# Patient Record
Sex: Male | Born: 1955 | Race: Black or African American | Hispanic: No | Marital: Married | Smoking: Former smoker
Health system: Southern US, Community
[De-identification: ages and names within clinical notes are randomized; demographics above are authoritative.]

## PROBLEM LIST (undated history)

## (undated) DIAGNOSIS — I10 Essential (primary) hypertension: Secondary | ICD-10-CM

## (undated) DIAGNOSIS — R7303 Prediabetes: Secondary | ICD-10-CM

## (undated) DIAGNOSIS — I639 Cerebral infarction, unspecified: Secondary | ICD-10-CM

## (undated) DIAGNOSIS — N289 Disorder of kidney and ureter, unspecified: Secondary | ICD-10-CM

## (undated) DIAGNOSIS — I2510 Atherosclerotic heart disease of native coronary artery without angina pectoris: Secondary | ICD-10-CM

## (undated) HISTORY — DX: Essential (primary) hypertension: I10

## (undated) HISTORY — PX: OTHER SURGICAL HISTORY: SHX169

## (undated) HISTORY — DX: Atherosclerotic heart disease of native coronary artery without angina pectoris: I25.10

## (undated) HISTORY — DX: Prediabetes: R73.03

## (undated) HISTORY — DX: Cerebral infarction, unspecified: I63.9

## (undated) HISTORY — DX: Disorder of kidney and ureter, unspecified: N28.9

---

## 2016-05-15 LAB — BASIC METABOLIC PANEL
BUN: 17 mg/dL (ref 4–21)
Creatinine: 4.3 mg/dL — AB (ref 0.6–1.3)
Glucose: 171 mg/dL
POTASSIUM: 3.6 mmol/L (ref 3.4–5.3)
SODIUM: 137 mmol/L (ref 137–147)

## 2016-05-15 LAB — HEPATIC FUNCTION PANEL
ALK PHOS: 107 U/L (ref 25–125)
ALT: 8 U/L — AB (ref 10–40)
AST: 16 U/L (ref 14–40)
Bilirubin, Total: 0.8 mg/dL

## 2016-05-15 LAB — CBC AND DIFFERENTIAL
HEMATOCRIT: 42 % (ref 41–53)
HEMOGLOBIN: 13.9 g/dL (ref 13.5–17.5)
Platelets: 168 10*3/uL (ref 150–399)
WBC: 17 10^3/mL

## 2016-05-15 LAB — POCT INR: INR: 1.1 (ref 0.9–1.1)

## 2016-05-16 LAB — BASIC METABOLIC PANEL
BUN: 30 mg/dL — AB (ref 4–21)
CREATININE: 6.3 mg/dL — AB (ref 0.6–1.3)
Glucose: 118 mg/dL
POTASSIUM: 3.2 mmol/L — AB (ref 3.4–5.3)
Sodium: 137 mmol/L (ref 137–147)

## 2016-05-16 LAB — CBC AND DIFFERENTIAL
HCT: 37 % — AB (ref 41–53)
HCT: 34 % — AB (ref 41–53)
HEMATOCRIT: 36 % — AB (ref 41–53)
HEMOGLOBIN: 10.9 g/dL — AB (ref 13.5–17.5)
HEMOGLOBIN: 11.9 g/dL — AB (ref 13.5–17.5)
HEMOGLOBIN: 12.4 g/dL — AB (ref 13.5–17.5)
PLATELETS: 148 10*3/uL — AB (ref 150–399)
WBC: 9.8 10^3/mL

## 2016-05-17 LAB — BASIC METABOLIC PANEL
BUN: 39 mg/dL — AB (ref 4–21)
Creatinine: 8.3 mg/dL — AB (ref 0.6–1.3)
GLUCOSE: 163 mg/dL
POTASSIUM: 3.1 mmol/L — AB (ref 3.4–5.3)
Sodium: 139 mmol/L (ref 137–147)

## 2016-05-17 LAB — HEPATIC FUNCTION PANEL
AST: 9 U/L — AB (ref 14–40)
Alkaline Phosphatase: 78 U/L (ref 25–125)
BILIRUBIN, TOTAL: 0.7 mg/dL

## 2016-05-17 LAB — CBC AND DIFFERENTIAL
HCT: 33 % — AB (ref 41–53)
HEMOGLOBIN: 10.9 g/dL — AB (ref 13.5–17.5)
Platelets: 141 10*3/uL — AB (ref 150–399)
WBC: 4.6 10*3/mL

## 2016-05-19 LAB — HEPATIC FUNCTION PANEL
ALK PHOS: 82 U/L (ref 25–125)
AST: 10 U/L — AB (ref 14–40)
BILIRUBIN, TOTAL: 0.5 mg/dL

## 2016-05-19 LAB — CBC AND DIFFERENTIAL
HEMATOCRIT: 31 % — AB (ref 41–53)
HEMOGLOBIN: 10.2 g/dL — AB (ref 13.5–17.5)
PLATELETS: 167 10*3/uL (ref 150–399)
WBC: 4.2 10^3/mL

## 2016-05-19 LAB — BASIC METABOLIC PANEL
BUN: 27 mg/dL — AB (ref 4–21)
Creatinine: 7.4 mg/dL — AB (ref 0.6–1.3)
Glucose: 180 mg/dL
Potassium: 3.8 mmol/L (ref 3.4–5.3)
SODIUM: 135 mmol/L — AB (ref 137–147)

## 2016-05-20 LAB — CBC AND DIFFERENTIAL
HEMATOCRIT: 34 % — AB (ref 41–53)
Hemoglobin: 10.9 g/dL — AB (ref 13.5–17.5)
PLATELETS: 178 10*3/uL (ref 150–399)
WBC: 4.4 10^3/mL

## 2016-05-21 LAB — HEPATIC FUNCTION PANEL
ALK PHOS: 89 U/L (ref 25–125)
AST: 9 U/L — AB (ref 14–40)
Bilirubin, Total: 0.3 mg/dL

## 2016-05-21 LAB — BASIC METABOLIC PANEL
BUN: 29 mg/dL — AB (ref 4–21)
Creatinine: 6.1 mg/dL — AB (ref 0.6–1.3)
GLUCOSE: 162 mg/dL
Potassium: 4.7 mmol/L (ref 3.4–5.3)
SODIUM: 136 mmol/L — AB (ref 137–147)

## 2016-05-24 ENCOUNTER — Encounter: Payer: Self-pay | Admitting: Internal Medicine

## 2016-05-24 ENCOUNTER — Non-Acute Institutional Stay (SKILLED_NURSING_FACILITY): Payer: Medicare Other | Admitting: Internal Medicine

## 2016-05-24 DIAGNOSIS — E1122 Type 2 diabetes mellitus with diabetic chronic kidney disease: Secondary | ICD-10-CM | POA: Insufficient documentation

## 2016-05-24 DIAGNOSIS — K2990 Gastroduodenitis, unspecified, without bleeding: Secondary | ICD-10-CM

## 2016-05-24 DIAGNOSIS — E1169 Type 2 diabetes mellitus with other specified complication: Secondary | ICD-10-CM

## 2016-05-24 DIAGNOSIS — N186 End stage renal disease: Secondary | ICD-10-CM | POA: Diagnosis not present

## 2016-05-24 DIAGNOSIS — I639 Cerebral infarction, unspecified: Secondary | ICD-10-CM | POA: Diagnosis not present

## 2016-05-24 DIAGNOSIS — Z992 Dependence on renal dialysis: Secondary | ICD-10-CM

## 2016-05-24 DIAGNOSIS — K2970 Gastritis, unspecified, without bleeding: Secondary | ICD-10-CM

## 2016-05-24 DIAGNOSIS — K922 Gastrointestinal hemorrhage, unspecified: Secondary | ICD-10-CM | POA: Diagnosis not present

## 2016-05-24 DIAGNOSIS — N289 Disorder of kidney and ureter, unspecified: Secondary | ICD-10-CM | POA: Insufficient documentation

## 2016-05-24 DIAGNOSIS — I69320 Aphasia following cerebral infarction: Secondary | ICD-10-CM

## 2016-05-24 DIAGNOSIS — I120 Hypertensive chronic kidney disease with stage 5 chronic kidney disease or end stage renal disease: Secondary | ICD-10-CM | POA: Diagnosis not present

## 2016-05-24 DIAGNOSIS — E785 Hyperlipidemia, unspecified: Secondary | ICD-10-CM

## 2016-05-24 DIAGNOSIS — IMO0002 Reserved for concepts with insufficient information to code with codable children: Secondary | ICD-10-CM

## 2016-05-24 DIAGNOSIS — E1339 Other specified diabetes mellitus with other diabetic ophthalmic complication: Secondary | ICD-10-CM | POA: Diagnosis not present

## 2016-05-24 DIAGNOSIS — H42 Glaucoma in diseases classified elsewhere: Secondary | ICD-10-CM

## 2016-05-24 DIAGNOSIS — N185 Chronic kidney disease, stage 5: Secondary | ICD-10-CM

## 2016-05-24 DIAGNOSIS — I2510 Atherosclerotic heart disease of native coronary artery without angina pectoris: Secondary | ICD-10-CM | POA: Insufficient documentation

## 2016-05-24 NOTE — Progress Notes (Signed)
MRN: [id number] Name: Kenneth Mckenzie  Sex: male Age: 60 y.o. DOB: 12-Apr-1956  PSC #:  Facility/Room: [facility] / [facility] P Level Of Care: SNF Provider: Randon Goldsmith. Lyn Hollingshead, MD Emergency Contacts: No emergency contact information on file.  Code Status: Full Code  Allergies: Zofran [ondansetron hcl]  Chief Complaint  Patient presents with  . New Admit To SNF    Admit to Facility    HPI: Patient is 60 y.o. male with ESRD on dialysis, CVA's, one as recent as 2-3 weeks ago, on eliquis who was admitted to [facility] from 8/27-9/2 with hematemesis. EGD showed gastritis/duodenitis and a mallory weiss rear. Pt was treated with protonix BID with ASA 81 mg only as stroke prophylaxis Hb dropped but not enough to require a transfusion..Pt is now stable and is admitted to SNF for OT/PT. While at at [facility] pt will be followed for multiple strokes,prophylaxed with ASA, HTN, tx with coreg and lisinopril and HLD, tx with zocor.  Past Medical History:  Diagnosis Date  . Borderline diabetic   . Coronary heart disease   . Hypertension   . Renal disease   . Stroke [facility])     Past Surgical History:  Procedure Laterality Date  . Open Heart Surgery        Medication List       Accurate as of 05/24/16 11:59 PM. Always use your most recent med list.          aspirin EC 81 MG tablet Take 81 mg by mouth daily.   carvedilol 25 MG tablet Commonly known as:  COREG Take 25 mg by mouth 2 (two) times daily with a meal.   insulin lispro 100 UNIT/ML injection Commonly known as:  HUMALOG Inject into the skin 3 (three) times daily before meals. Use Sliding scale   lisinopril 40 MG tablet Commonly known as:  PRINIVIL,ZESTRIL Take 40 mg by mouth daily.   simvastatin 20 MG tablet Commonly known as:  ZOCOR Take 20 mg by mouth at bedtime.   timolol 0.5 % ophthalmic solution Commonly known as:  BETIMOL Place 1 drop into both eyes 2 (two) times daily.       Meds ordered this encounter  Medications  .  aspirin EC 81 MG tablet    Sig: Take 81 mg by mouth daily.  . carvedilol (COREG) 25 MG tablet    Sig: Take 25 mg by mouth 2 (two) times daily with a meal.  . insulin lispro (HUMALOG) 100 UNIT/ML injection    Sig: Inject into the skin 3 (three) times daily before meals. Use Sliding scale  . lisinopril (PRINIVIL,ZESTRIL) 40 MG tablet    Sig: Take 40 mg by mouth daily.  . simvastatin (ZOCOR) 20 MG tablet    Sig: Take 20 mg by mouth at bedtime.  . timolol (BETIMOL) 0.5 % ophthalmic solution    Sig: Place 1 drop into both eyes 2 (two) times daily.    Immunization History  Administered Date(s) Administered  . PPD Test 05/21/2016    Social History  Substance Use Topics  . Smoking status: Former Games developer  . Smokeless tobacco: Never Used  . Alcohol use No    Family history is   Family History  Problem Relation Age of Onset  . Diabetes Mother   . Hypertension Mother   . Diabetes Father   . Hypertension Father       Review of Systems  UTO 2/2 aphasia    Vitals:   05/24/16 1041  BP:  137/73  Pulse: (!) 56  Resp: 20  Temp: 97.1 F (36.2 C)    SpO2 Readings from Last 1 Encounters:  No data found for SpO2        Physical Exam  GENERAL APPEARANCE: Alert, minconversant,  No acute distress.  SKIN: No diaphoresis rash HEAD: Normocephalic, atraumatic  EYES: Conjunctiva/lids clear. Pupils round, reactive. EOMs intact.  EARS: External exam WNL, canals clear. Hearing grossly normal.  NOSE: No deformity or discharge.  MOUTH/THROAT: Lips w/o lesions  RESPIRATORY: Breathing is even, unlabored. Lung sounds are clear   CARDIOVASCULAR: Heart RRR no murmurs, rubs or gallops. No peripheral edema.   GASTROINTESTINAL: Abdomen is soft, non-tender, not distended w/ normal bowel sounds. GENITOURINARY: Bladder non tender, not distended  MUSCULOSKELETAL: R hand muscle wasting NEUROLOGIC:  Cranial nerves 2-12 grossly intact; R hemiparesis, L hemiparesis with limb ataxia  PSYCHIATRIC:  frustrated, cognitive problem?, no behavioral issues  Patient Active Problem List   Diagnosis Date Noted  . Borderline diabetic   . Hypertension   . Renal disease   . Coronary heart disease   . Stroke [facility](HCC)        Component Value Date/Time   WBC 4.4 05/20/2016   HGB 10.9 (A) 05/20/2016   HCT 34 (A) 05/20/2016   PLT 178 05/20/2016        Component Value Date/Time   NA 136 (A) 05/21/2016   K 4.7 05/21/2016   BUN 29 (A) 05/21/2016   CREATININE 6.1 (A) 05/21/2016   AST 9 (A) 05/21/2016   ALT 8 (A) 05/15/2016 0329   ALKPHOS 89 05/21/2016    No results found for: HGBA1C  No results found for: CHOL, HDL, LDLCALC, LDLDIRECT, TRIG, CHOLHDL   Patient was never admitted.  Not all labs, radiology exams or other studies done during hospitalization come through on my EPIC note; however they are reviewed by me.    Assessment and Plan  UPPER GI BLEED/ GASTRITIS/ DUODENITIS- pt presented with hematemesis;EGD revealed gastritis, duodenitis and mallory weiss tear. Pt was put on protonix 40 mg BID; Hb dropped from 13.9 to 10.9 SNF - pt is admitted for OT/PT; will f/u CBC  MULTIPLE STROKES/ APHASIA - per hx pt has had a stroke prior, was placed on eliquis which was stopped2/2 to GI bleed then restarted. Recent stroke 2 weeks ago, [facility] because pt had a stroke while in process of moving to [place] but was signed out by family and brought to ED vomiting blood.  SNF - being prophylaxed now with ASA 81 mg daily; admitted for OT/PT, maybe residential care. Pt will require all ADL care including feeding  ESRD on dialysis- TTS schedule SNF - cont dialysis  DM2 - diet controlled SNF - was d/c with SSI; will follow BS ac, goal will be to get pt on set dose of insulin with meals; pt is on ACE and statin  HTN SNF - cont soreg 25 mg BID and lisinopril 40 mg daily; controlled  HLD SNF - not stated as uncontrolled;cont Zocor 20 mg nightly  GLAUCOMA SNF- CONT TIMOLOL ou  bid  tIME SPENT > 45 MIN;> 50% of time with patient was spent reviewing records, labs, tests and studies, counseling and developing plan of care  Thurston Holenne D. Lyn HollingsheadAlexander, MD

## 2016-05-29 ENCOUNTER — Encounter: Payer: Self-pay | Admitting: Internal Medicine

## 2016-05-29 DIAGNOSIS — N186 End stage renal disease: Secondary | ICD-10-CM | POA: Insufficient documentation

## 2016-05-29 DIAGNOSIS — E785 Hyperlipidemia, unspecified: Secondary | ICD-10-CM

## 2016-05-29 DIAGNOSIS — IMO0002 Reserved for concepts with insufficient information to code with codable children: Secondary | ICD-10-CM | POA: Insufficient documentation

## 2016-05-29 DIAGNOSIS — K922 Gastrointestinal hemorrhage, unspecified: Secondary | ICD-10-CM | POA: Insufficient documentation

## 2016-05-29 DIAGNOSIS — K2990 Gastroduodenitis, unspecified, without bleeding: Secondary | ICD-10-CM

## 2016-05-29 DIAGNOSIS — Z992 Dependence on renal dialysis: Secondary | ICD-10-CM

## 2016-05-29 DIAGNOSIS — E1339 Other specified diabetes mellitus with other diabetic ophthalmic complication: Secondary | ICD-10-CM | POA: Insufficient documentation

## 2016-05-29 DIAGNOSIS — H42 Glaucoma in diseases classified elsewhere: Secondary | ICD-10-CM

## 2016-05-29 DIAGNOSIS — K2970 Gastritis, unspecified, without bleeding: Secondary | ICD-10-CM | POA: Insufficient documentation

## 2016-05-29 DIAGNOSIS — E1169 Type 2 diabetes mellitus with other specified complication: Secondary | ICD-10-CM | POA: Insufficient documentation

## 2016-06-07 ENCOUNTER — Non-Acute Institutional Stay (SKILLED_NURSING_FACILITY): Payer: Medicare Other | Admitting: Internal Medicine

## 2016-06-07 ENCOUNTER — Encounter: Payer: Self-pay | Admitting: Internal Medicine

## 2016-06-07 DIAGNOSIS — E1122 Type 2 diabetes mellitus with diabetic chronic kidney disease: Secondary | ICD-10-CM

## 2016-06-07 DIAGNOSIS — IMO0002 Reserved for concepts with insufficient information to code with codable children: Secondary | ICD-10-CM

## 2016-06-07 DIAGNOSIS — I120 Hypertensive chronic kidney disease with stage 5 chronic kidney disease or end stage renal disease: Secondary | ICD-10-CM | POA: Diagnosis not present

## 2016-06-07 DIAGNOSIS — E1339 Other specified diabetes mellitus with other diabetic ophthalmic complication: Secondary | ICD-10-CM | POA: Diagnosis not present

## 2016-06-07 DIAGNOSIS — N186 End stage renal disease: Secondary | ICD-10-CM | POA: Diagnosis not present

## 2016-06-07 DIAGNOSIS — K2990 Gastroduodenitis, unspecified, without bleeding: Secondary | ICD-10-CM | POA: Diagnosis not present

## 2016-06-07 DIAGNOSIS — Z992 Dependence on renal dialysis: Secondary | ICD-10-CM | POA: Diagnosis not present

## 2016-06-07 DIAGNOSIS — E1169 Type 2 diabetes mellitus with other specified complication: Secondary | ICD-10-CM | POA: Diagnosis not present

## 2016-06-07 DIAGNOSIS — I69320 Aphasia following cerebral infarction: Secondary | ICD-10-CM

## 2016-06-07 DIAGNOSIS — N185 Chronic kidney disease, stage 5: Secondary | ICD-10-CM

## 2016-06-07 DIAGNOSIS — H42 Glaucoma in diseases classified elsewhere: Secondary | ICD-10-CM | POA: Diagnosis not present

## 2016-06-07 DIAGNOSIS — K2970 Gastritis, unspecified, without bleeding: Secondary | ICD-10-CM

## 2016-06-07 DIAGNOSIS — I639 Cerebral infarction, unspecified: Secondary | ICD-10-CM | POA: Diagnosis not present

## 2016-06-07 DIAGNOSIS — K922 Gastrointestinal hemorrhage, unspecified: Secondary | ICD-10-CM | POA: Diagnosis not present

## 2016-06-07 DIAGNOSIS — E785 Hyperlipidemia, unspecified: Secondary | ICD-10-CM

## 2016-06-07 NOTE — Progress Notes (Signed)
MRN: [id number] Name: Kenneth Mckenzie  Sex: male Age: 60 y.o. DOB: December 21, 1955  PSC #:  Facility/Room: [facility] / [facility] Level Of Care: SNF Provider: Randon Goldsmith. Lyn Hollingshead, MD Emergency Contacts: No emergency contact information on file.  Code Status: Full Code  Allergies: Zofran [ondansetron hcl]  Chief Complaint  Patient presents with  . Discharge Note    Discharged from SNF    HPI: Patient is 60 y.o. male with ESRD on dialysis, CVA's, one as recent as 2-3 weeks ago, on eliquis who was admitted to [facility] from 8/27-9/2 with hematemesis. EGD showed gastritis/duodenitis and a mallory weiss rear. Pt was treated with protonix BID with ASA 81 mg only as stroke prophylaxis Hb dropped but not enough to require a transfusion. Pt was admitted to SNF for OT/PT and is now ready to be d/c to home.  Past Medical History:  Diagnosis Date  . Borderline diabetic   . Coronary heart disease   . Hypertension   . Renal disease   . Stroke [facility])     Past Surgical History:  Procedure Laterality Date  . Open Heart Surgery        Medication List       Accurate as of 06/07/16  2:49 PM. Always use your most recent med list.          aspirin EC 81 MG tablet Take 81 mg by mouth daily.   carvedilol 25 MG tablet Commonly known as:  COREG Take 25 mg by mouth 2 (two) times daily with a meal.   insulin lispro 100 UNIT/ML injection Commonly known as:  HUMALOG Inject into the skin 3 (three) times daily before meals. Use Sliding scale 70 - 120 = 0 units, 121 - 150 = 1 units; 151 - 200 = 2 units, 201 - 250 = 3 units, 251 - 300 = 5 units, 301 - 350 = 7 units, 351 - 400 = 9 units; Greater than 400 call MD and give 9 units   lisinopril 40 MG tablet Commonly known as:  PRINIVIL,ZESTRIL Take 40 mg by mouth daily.   pantoprazole 40 MG tablet Commonly known as:  PROTONIX Take 40 mg by mouth 2 (two) times daily. Take twice daily for 6 weeks, then take 1 daily.   simvastatin 20 MG tablet Commonly known  as:  ZOCOR Take 20 mg by mouth at bedtime.   timolol 0.5 % ophthalmic solution Commonly known as:  BETIMOL Place 1 drop into both eyes 2 (two) times daily.       Meds ordered this encounter  Medications  . pantoprazole (PROTONIX) 40 MG tablet    Sig: Take 40 mg by mouth 2 (two) times daily. Take twice daily for 6 weeks, then take 1 daily.    Immunization History  Administered Date(s) Administered  . PPD Test 05/21/2016    Social History  Substance Use Topics  . Smoking status: Former Games developer  . Smokeless tobacco: Never Used  . Alcohol use No    Vitals:   06/07/16 1000  Pulse: 70  Resp: 20    Physical Exam  GENERAL APPEARANCE: Alert, conversant. No acute distress.  HEENT: Unremarkable. RESPIRATORY: Breathing is even, unlabored. Lung sounds are clear   CARDIOVASCULAR: Heart RRR no murmurs, rubs or gallops. No peripheral edema.  GASTROINTESTINAL: Abdomen is soft, non-tender, not distended w/ normal bowel sounds.  NEUROLOGIC: Cranial nerves 2-12 grossly intact x aphasia; R hemiparesis; L hemiparesis with ataxia  Patient Active Problem List   Diagnosis Date Noted  .  Acute upper GI bleed 05/29/2016  . Gastritis and gastroduodenitis 05/29/2016  . Aphasia complicating stroke 05/29/2016  . ESRD on dialysis (HCC) 05/29/2016  . Hyperlipidemia associated with type 2 diabetes mellitus (HCC) 05/29/2016  . Glaucoma due to secondary diabetes (HCC) 05/29/2016  . DM type 2 causing ESRD (HCC)   . Hypertension associated with stage 5 chronic kidney disease due to type 2 diabetes mellitus (HCC)   . Renal disease   . Coronary heart disease   . CVA (cerebrovascular accident) (HCC)     CBC    Component Value Date/Time   WBC 4.4 05/20/2016   HGB 10.9 (A) 05/20/2016   HCT 34 (A) 05/20/2016   PLT 178 05/20/2016    CMP     Component Value Date/Time   NA 136 (A) 05/21/2016   K 4.7 05/21/2016   BUN 29 (A) 05/21/2016   CREATININE 6.1 (A) 05/21/2016   AST 9 (A) 05/21/2016    ALT 8 (A) 05/15/2016 0329   ALKPHOS 89 05/21/2016    Assessment and Plan  Pt is being d/c to home with HH/OT/PT/Nursing. DME needed is mechanical lift. Medications have been reconciled and Rx's written.   Time spent > 30 min;> 50% of time with patient was spent reviewing records, labs, tests and studies, counseling and developing plan of care  Randon Goldsmithnne D. Lyn HollingsheadAlexander, MD

## 2017-01-23 ENCOUNTER — Telehealth: Payer: Self-pay | Admitting: Surgery

## 2017-01-23 ENCOUNTER — Encounter: Payer: Self-pay | Admitting: Surgery

## 2017-01-23 NOTE — Telephone Encounter (Signed)
pt canceled via phone service because they didn't know what the appt was for, I tried to call to let them know why Dr Lowell GuitarPowell felt it was of an urgent need to be seen (due to Ulcer over access area) No reply, cancel per pt, I called Dr Roanna BanningPowell's office at [phone], to inform them of this so they can handle it from here. 01/23/17 bg

## 2017-02-24 ENCOUNTER — Other Ambulatory Visit: Payer: Self-pay | Admitting: *Deleted

## 2017-02-24 DIAGNOSIS — N186 End stage renal disease: Secondary | ICD-10-CM

## 2017-02-24 DIAGNOSIS — Z01810 Encounter for preprocedural cardiovascular examination: Secondary | ICD-10-CM

## 2017-02-27 ENCOUNTER — Other Ambulatory Visit (HOSPITAL_COMMUNITY): Payer: Self-pay

## 2017-02-27 ENCOUNTER — Inpatient Hospital Stay (HOSPITAL_COMMUNITY): Admission: RE | Admit: 2017-02-27 | Payer: Self-pay | Source: Ambulatory Visit

## 2017-02-27 ENCOUNTER — Encounter: Payer: Self-pay | Admitting: Surgery

## 2017-03-01 ENCOUNTER — Other Ambulatory Visit (HOSPITAL_COMMUNITY): Payer: Self-pay

## 2017-03-01 ENCOUNTER — Encounter (HOSPITAL_COMMUNITY): Payer: Self-pay

## 2017-03-01 ENCOUNTER — Encounter: Payer: Self-pay | Admitting: Vascular Surgery

## 2017-09-22 ENCOUNTER — Other Ambulatory Visit: Payer: Self-pay

## 2017-09-22 ENCOUNTER — Inpatient Hospital Stay (HOSPITAL_COMMUNITY)
Admission: EM | Admit: 2017-09-22 | Discharge: 2017-09-29 | DRG: 177 | Disposition: A | Discharge status: Hospice | Payer: Medicare Other | Attending: Oncology | Admitting: Oncology

## 2017-09-22 ENCOUNTER — Other Ambulatory Visit (HOSPITAL_COMMUNITY): Payer: Self-pay

## 2017-09-22 ENCOUNTER — Emergency Department (HOSPITAL_COMMUNITY): Payer: Medicare Other

## 2017-09-22 DIAGNOSIS — R000 Tachycardia, unspecified: Secondary | ICD-10-CM | POA: Diagnosis present

## 2017-09-22 DIAGNOSIS — F322 Major depressive disorder, single episode, severe without psychotic features: Secondary | ICD-10-CM | POA: Diagnosis not present

## 2017-09-22 DIAGNOSIS — I2510 Atherosclerotic heart disease of native coronary artery without angina pectoris: Secondary | ICD-10-CM | POA: Diagnosis present

## 2017-09-22 DIAGNOSIS — Z833 Family history of diabetes mellitus: Secondary | ICD-10-CM

## 2017-09-22 DIAGNOSIS — Z888 Allergy status to other drugs, medicaments and biological substances status: Secondary | ICD-10-CM

## 2017-09-22 DIAGNOSIS — E876 Hypokalemia: Secondary | ICD-10-CM

## 2017-09-22 DIAGNOSIS — Z79899 Other long term (current) drug therapy: Secondary | ICD-10-CM

## 2017-09-22 DIAGNOSIS — Z992 Dependence on renal dialysis: Secondary | ICD-10-CM | POA: Diagnosis not present

## 2017-09-22 DIAGNOSIS — D72829 Elevated white blood cell count, unspecified: Secondary | ICD-10-CM | POA: Diagnosis not present

## 2017-09-22 DIAGNOSIS — Z7901 Long term (current) use of anticoagulants: Secondary | ICD-10-CM

## 2017-09-22 DIAGNOSIS — D649 Anemia, unspecified: Secondary | ICD-10-CM | POA: Diagnosis not present

## 2017-09-22 DIAGNOSIS — Z8701 Personal history of pneumonia (recurrent): Secondary | ICD-10-CM

## 2017-09-22 DIAGNOSIS — I120 Hypertensive chronic kidney disease with stage 5 chronic kidney disease or end stage renal disease: Secondary | ICD-10-CM | POA: Diagnosis present

## 2017-09-22 DIAGNOSIS — Y95 Nosocomial condition: Secondary | ICD-10-CM | POA: Diagnosis present

## 2017-09-22 DIAGNOSIS — N186 End stage renal disease: Secondary | ICD-10-CM | POA: Diagnosis present

## 2017-09-22 DIAGNOSIS — R1310 Dysphagia, unspecified: Secondary | ICD-10-CM

## 2017-09-22 DIAGNOSIS — Z66 Do not resuscitate: Secondary | ICD-10-CM | POA: Diagnosis present

## 2017-09-22 DIAGNOSIS — I998 Other disorder of circulatory system: Secondary | ICD-10-CM | POA: Diagnosis present

## 2017-09-22 DIAGNOSIS — R791 Abnormal coagulation profile: Secondary | ICD-10-CM | POA: Diagnosis not present

## 2017-09-22 DIAGNOSIS — Z8249 Family history of ischemic heart disease and other diseases of the circulatory system: Secondary | ICD-10-CM

## 2017-09-22 DIAGNOSIS — E43 Unspecified severe protein-calorie malnutrition: Secondary | ICD-10-CM | POA: Diagnosis present

## 2017-09-22 DIAGNOSIS — I739 Peripheral vascular disease, unspecified: Secondary | ICD-10-CM | POA: Diagnosis not present

## 2017-09-22 DIAGNOSIS — J189 Pneumonia, unspecified organism: Secondary | ICD-10-CM

## 2017-09-22 DIAGNOSIS — Z87891 Personal history of nicotine dependence: Secondary | ICD-10-CM

## 2017-09-22 DIAGNOSIS — F319 Bipolar disorder, unspecified: Secondary | ICD-10-CM | POA: Diagnosis present

## 2017-09-22 DIAGNOSIS — J90 Pleural effusion, not elsewhere classified: Secondary | ICD-10-CM | POA: Diagnosis present

## 2017-09-22 DIAGNOSIS — I69391 Dysphagia following cerebral infarction: Secondary | ICD-10-CM

## 2017-09-22 DIAGNOSIS — L89152 Pressure ulcer of sacral region, stage 2: Secondary | ICD-10-CM | POA: Diagnosis present

## 2017-09-22 DIAGNOSIS — R0600 Dyspnea, unspecified: Secondary | ICD-10-CM | POA: Diagnosis not present

## 2017-09-22 DIAGNOSIS — Z7189 Other specified counseling: Secondary | ICD-10-CM | POA: Diagnosis not present

## 2017-09-22 DIAGNOSIS — J690 Pneumonitis due to inhalation of food and vomit: Secondary | ICD-10-CM | POA: Diagnosis present

## 2017-09-22 DIAGNOSIS — R627 Adult failure to thrive: Secondary | ICD-10-CM | POA: Diagnosis present

## 2017-09-22 DIAGNOSIS — Z681 Body mass index (BMI) 19 or less, adult: Secondary | ICD-10-CM

## 2017-09-22 DIAGNOSIS — Z794 Long term (current) use of insulin: Secondary | ICD-10-CM

## 2017-09-22 DIAGNOSIS — E1122 Type 2 diabetes mellitus with diabetic chronic kidney disease: Secondary | ICD-10-CM | POA: Diagnosis present

## 2017-09-22 DIAGNOSIS — Z7401 Bed confinement status: Secondary | ICD-10-CM

## 2017-09-22 DIAGNOSIS — Z993 Dependence on wheelchair: Secondary | ICD-10-CM | POA: Diagnosis not present

## 2017-09-22 DIAGNOSIS — Z515 Encounter for palliative care: Secondary | ICD-10-CM | POA: Diagnosis present

## 2017-09-22 DIAGNOSIS — R64 Cachexia: Secondary | ICD-10-CM | POA: Diagnosis present

## 2017-09-22 DIAGNOSIS — Z7982 Long term (current) use of aspirin: Secondary | ICD-10-CM

## 2017-09-22 DIAGNOSIS — I69321 Dysphasia following cerebral infarction: Secondary | ICD-10-CM | POA: Diagnosis not present

## 2017-09-22 DIAGNOSIS — R1312 Dysphagia, oropharyngeal phase: Secondary | ICD-10-CM | POA: Diagnosis not present

## 2017-09-22 DIAGNOSIS — M6249 Contracture of muscle, multiple sites: Secondary | ICD-10-CM | POA: Diagnosis not present

## 2017-09-22 DIAGNOSIS — M6259 Muscle wasting and atrophy, not elsewhere classified, multiple sites: Secondary | ICD-10-CM | POA: Diagnosis not present

## 2017-09-22 DIAGNOSIS — D631 Anemia in chronic kidney disease: Secondary | ICD-10-CM | POA: Diagnosis present

## 2017-09-22 DIAGNOSIS — I69322 Dysarthria following cerebral infarction: Secondary | ICD-10-CM

## 2017-09-22 LAB — COMPREHENSIVE METABOLIC PANEL
ALBUMIN: 1.7 g/dL — AB (ref 3.5–5.0)
ALT: 10 U/L — ABNORMAL LOW (ref 17–63)
AST: 23 U/L (ref 15–41)
Alkaline Phosphatase: 129 U/L — ABNORMAL HIGH (ref 38–126)
Anion gap: 19 — ABNORMAL HIGH (ref 5–15)
BUN: 5 mg/dL — AB (ref 6–20)
CO2: 24 mmol/L (ref 22–32)
Calcium: 7.9 mg/dL — ABNORMAL LOW (ref 8.9–10.3)
Chloride: 96 mmol/L — ABNORMAL LOW (ref 101–111)
Creatinine, Ser: 2.57 mg/dL — ABNORMAL HIGH (ref 0.61–1.24)
GFR calc Af Amer: 29 mL/min — ABNORMAL LOW (ref >60)
GFR calc non Af Amer: 25 mL/min — ABNORMAL LOW (ref >60)
Glucose, Bld: 88 mg/dL (ref 65–99)
POTASSIUM: 2.2 mmol/L — AB (ref 3.5–5.1)
Sodium: 139 mmol/L (ref 135–145)
Total Bilirubin: 1.6 mg/dL — ABNORMAL HIGH (ref 0.3–1.2)
Total Protein: 7.8 g/dL (ref 6.5–8.1)

## 2017-09-22 LAB — PHOSPHORUS
Phosphorus: 1.3 mg/dL — ABNORMAL LOW (ref 2.5–4.6)
Phosphorus: 2.4 mg/dL — ABNORMAL LOW (ref 2.5–4.6)

## 2017-09-22 LAB — CBC WITH DIFFERENTIAL/PLATELET
BASOS PCT: 0 %
Basophils Absolute: 0.1 10*3/uL (ref 0.0–0.1)
Eosinophils Absolute: 0 10*3/uL (ref 0.0–0.7)
Eosinophils Relative: 0 %
HEMATOCRIT: 30.4 % — AB (ref 39.0–52.0)
HEMOGLOBIN: 9.3 g/dL — AB (ref 13.0–17.0)
LYMPHS ABS: 1 10*3/uL (ref 0.7–4.0)
Lymphocytes Relative: 4 %
MCH: 28.1 pg (ref 26.0–34.0)
MCHC: 30.6 g/dL (ref 30.0–36.0)
MCV: 91.8 fL (ref 78.0–100.0)
MONOS PCT: 8 %
Monocytes Absolute: 1.8 10*3/uL — ABNORMAL HIGH (ref 0.1–1.0)
NEUTROS ABS: 20.6 10*3/uL — AB (ref 1.7–7.7)
NEUTROS PCT: 88 %
Platelets: 367 10*3/uL (ref 150–400)
RBC: 3.31 MIL/uL — ABNORMAL LOW (ref 4.22–5.81)
RDW: 15.7 % — ABNORMAL HIGH (ref 11.5–15.5)
WBC: 23.6 10*3/uL — ABNORMAL HIGH (ref 4.0–10.5)

## 2017-09-22 LAB — MAGNESIUM: MAGNESIUM: 1.7 mg/dL (ref 1.7–2.4)

## 2017-09-22 LAB — POTASSIUM: Potassium: 2.9 mmol/L — ABNORMAL LOW (ref 3.5–5.1)

## 2017-09-22 LAB — BASIC METABOLIC PANEL
ANION GAP: 13 (ref 5–15)
BUN: 8 mg/dL (ref 6–20)
CALCIUM: 7.5 mg/dL — AB (ref 8.9–10.3)
CHLORIDE: 98 mmol/L — AB (ref 101–111)
CO2: 27 mmol/L (ref 22–32)
CREATININE: 2.86 mg/dL — AB (ref 0.61–1.24)
GFR calc Af Amer: 26 mL/min — ABNORMAL LOW (ref >60)
GFR calc non Af Amer: 22 mL/min — ABNORMAL LOW (ref >60)
GLUCOSE: 152 mg/dL — AB (ref 65–99)
Potassium: 2.5 mmol/L — CL (ref 3.5–5.1)
Sodium: 138 mmol/L (ref 135–145)

## 2017-09-22 LAB — PROTIME-INR
INR: 1.42
Prothrombin Time: 17.2 seconds — ABNORMAL HIGH (ref 11.4–15.2)

## 2017-09-22 LAB — I-STAT CG4 LACTIC ACID, ED
Lactic Acid, Venous: 2.09 mmol/L (ref 0.5–1.9)
Lactic Acid, Venous: 2.94 mmol/L (ref 0.5–1.9)

## 2017-09-22 LAB — MRSA PCR SCREENING: MRSA BY PCR: NEGATIVE

## 2017-09-22 LAB — LACTIC ACID, PLASMA: Lactic Acid, Venous: 1.7 mmol/L (ref 0.5–1.9)

## 2017-09-22 MED ORDER — VANCOMYCIN HCL IN DEXTROSE 1-5 GM/200ML-% IV SOLN: 1000.0000 mg | Freq: Once | INTRAVENOUS | Status: DC

## 2017-09-22 MED ORDER — SODIUM CHLORIDE 0.9 % IV BOLUS (SEPSIS)
500.0000 mL | Freq: Once | INTRAVENOUS | Status: AC
  Administered 2017-09-22: 500 mL via INTRAVENOUS

## 2017-09-22 MED ORDER — POTASSIUM CHLORIDE 10 MEQ/100ML IV SOLN: 10.0000 meq | INTRAVENOUS | Status: DC

## 2017-09-22 MED ORDER — APIXABAN 2.5 MG PO TABS
2.5000 mg | ORAL_TABLET | Freq: Two times a day (BID) | ORAL | Status: DC
  Administered 2017-09-22 – 2017-09-29 (×12): 2.5 mg via ORAL
  Filled 2017-09-22 (×14): qty 1

## 2017-09-22 MED ORDER — MAGNESIUM SULFATE 2 GM/50ML IV SOLN
2.0000 g | Freq: Once | INTRAVENOUS | Status: AC
  Administered 2017-09-23: 2 g via INTRAVENOUS
  Filled 2017-09-22: qty 50

## 2017-09-22 MED ORDER — SODIUM CHLORIDE 0.9% FLUSH: 3.0000 mL | INTRAVENOUS | Status: DC | PRN

## 2017-09-22 MED ORDER — VANCOMYCIN HCL 500 MG IV SOLR: 500.0000 mg | INTRAVENOUS | Status: DC

## 2017-09-22 MED ORDER — SODIUM CHLORIDE 0.9% FLUSH
3.0000 mL | Freq: Two times a day (BID) | INTRAVENOUS | Status: DC
  Administered 2017-09-22 – 2017-09-28 (×6): 3 mL via INTRAVENOUS

## 2017-09-22 MED ORDER — ORAL CARE MOUTH RINSE
15.0000 mL | Freq: Two times a day (BID) | OROMUCOSAL | Status: DC
  Administered 2017-09-22 – 2017-09-29 (×11): 15 mL via OROMUCOSAL

## 2017-09-22 MED ORDER — SODIUM CHLORIDE 0.9 % IV SOLN
INTRAVENOUS | Status: AC
  Administered 2017-09-22: 10:00:00 via INTRAVENOUS

## 2017-09-22 MED ORDER — PIPERACILLIN-TAZOBACTAM IN DEX 2-0.25 GM/50ML IV SOLN
2.2500 g | Freq: Three times a day (TID) | INTRAVENOUS | Status: DC
  Administered 2017-09-22: 2.25 g via INTRAVENOUS
  Filled 2017-09-22 (×3): qty 50

## 2017-09-22 MED ORDER — POTASSIUM CHLORIDE 10 MEQ/100ML IV SOLN
10.0000 meq | INTRAVENOUS | Status: AC
  Administered 2017-09-22 (×4): 10 meq via INTRAVENOUS
  Filled 2017-09-22 (×4): qty 100

## 2017-09-22 MED ORDER — TIMOLOL MALEATE 0.5 % OP SOLN
1.0000 [drp] | Freq: Two times a day (BID) | OPHTHALMIC | Status: DC
Start: 2017-09-22 — End: 2017-09-29
  Administered 2017-09-22 – 2017-09-29 (×13): 1 [drp] via OPHTHALMIC
  Filled 2017-09-22: qty 5

## 2017-09-22 MED ORDER — DEXTROSE 5 % IV SOLN
2.0000 g | Freq: Once | INTRAVENOUS | Status: AC
  Administered 2017-09-22: 2 g via INTRAVENOUS
  Filled 2017-09-22: qty 2

## 2017-09-22 MED ORDER — POTASSIUM CHLORIDE 10 MEQ/100ML IV SOLN
10.0000 meq | INTRAVENOUS | Status: AC
  Administered 2017-09-22 (×2): 10 meq via INTRAVENOUS
  Filled 2017-09-22 (×2): qty 100

## 2017-09-22 MED ORDER — SODIUM CHLORIDE 0.9% FLUSH
3.0000 mL | Freq: Two times a day (BID) | INTRAVENOUS | Status: DC
  Administered 2017-09-25 – 2017-09-26 (×2): 3 mL via INTRAVENOUS

## 2017-09-22 MED ORDER — SODIUM CHLORIDE 0.9 % IV SOLN: 250.0000 mL | INTRAVENOUS | Status: DC | PRN

## 2017-09-22 MED ORDER — POTASSIUM CHLORIDE 10 MEQ/100ML IV SOLN
10.0000 meq | INTRAVENOUS | Status: AC
  Administered 2017-09-23 (×2): 10 meq via INTRAVENOUS
  Filled 2017-09-22 (×2): qty 100

## 2017-09-22 MED ORDER — DEXTROSE 5 % IV SOLN
20.0000 mmol | Freq: Once | INTRAVENOUS | Status: AC
  Administered 2017-09-22: 20 mmol via INTRAVENOUS
  Filled 2017-09-22: qty 6.67

## 2017-09-22 MED ORDER — PIPERACILLIN-TAZOBACTAM 3.375 G IVPB
3.3750 g | Freq: Two times a day (BID) | INTRAVENOUS | Status: DC
  Administered 2017-09-22 – 2017-09-27 (×9): 3.375 g via INTRAVENOUS
  Filled 2017-09-22 (×10): qty 50

## 2017-09-22 MED ORDER — CLINDAMYCIN PHOSPHATE 600 MG/50ML IV SOLN: 600.0000 mg | Freq: Once | INTRAVENOUS | Status: DC

## 2017-09-22 NOTE — ED Notes (Signed)
Patient resting in bed with spouse and children at his bedside. Patient cleaned, linen changed, no signs of pain/discomfort observed

## 2017-09-22 NOTE — H&P (Signed)
Date: 09/22/2017               Patient Name:  Kenneth Mckenzie MRN: [id number]  DOB: [birth year] Age / Sex: 62 y.o., male   PCP: Patient, No Pcp Per         Medical Service: Internal Medicine Teaching Service         Attending Physician: Dr. Mikey Bussing, Marthenia Rolling, DO    First Contact: Dr. Saunders Revel Pager: [phone]  Second Contact: Dr. Obie Dredge Pager: [phone]       After Hours (After 5p/  First Contact Pager: [phone]  weekends / holidays): Second Contact Pager: [phone]   Chief Complaint: Abnormal breathing  History of Present Illness:  Mr. Kenneth Mckenzie is a 62 year old male with an extensive past medical history significant for multiple CVAs, wheel-chair bound, frequent aspiration events, ESRD on HD (T,Th,S) presenting following recent discharge from [facility] with his wife for concerns for abnormal breathing. Patient is minimally responsive to questoins and will only nod yes or no to questions. Most of the history is obtained from the wife at bedside and from chart review. His wife reports that yesterday evening she noted that he was not breathing normally, taking shallow breaths and coughing. He sounded congested and wheezy to her and called EMS for evaluation. She reports that he was recently discharged from the hospital for double pneumonia and that he never got over the infection.  He was admitted twice recently in December to [facility] with prolonged hospital courses. Was admitted from 12/8-12/19 requiring intubation and pressors associated with sepsis. Improved to his baseline and was discharged. Returned on 12/25 and found to have recurrent aspiration pneumonia due to his dysphagia from multiple previous embolic strokes. Treated with 7 day course of Zosyn at that time and discharged at his baseline health on 09/19/2017.  Upon arrival to the ED today he was afebrile, no increased work of breathing, satting well on room air. He was mildly tachycardic with a white count elevated to  23K and left shift. CXR done in the ED was notable for persistent but improved bibasilar infiltrates but improved compared to previous.   Meds:  Current Meds  Medication Sig  . apixaban (ELIQUIS) 2.5 MG TABS tablet Take 2.5 mg by mouth 2 (two) times daily.  . metoprolol tartrate (LOPRESSOR) 25 MG tablet Take 12.5 mg by mouth 2 (two) times daily.  . timolol (BETIMOL) 0.5 % ophthalmic solution Place 1 drop into both eyes 2 (two) times daily.     Allergies: Allergies as of 09/22/2017 - Review Complete 09/22/2017  Allergen Reaction Noted  . Zofran [ondansetron hcl] Nausea And Vomiting 05/24/2016   Past Medical History:  Diagnosis Date  . Borderline diabetic   . Coronary heart disease   . Hypertension   . Renal disease   . Stroke [facility])     Family History:  Family History  Problem Relation Age of Onset  . Diabetes Mother   . Hypertension Mother   . Diabetes Father   . Hypertension Father    Social History:  Social History   Socioeconomic History  . Marital status: Married    Spouse name: Not on file  . Number of children: Not on file  . Years of education: Not on file  . Highest education level: Not on file  Social Needs  . Financial resource strain: Not on file  . Food insecurity - worry: Not on file  . Food insecurity - inability:  Not on file  . Transportation needs - medical: Not on file  . Transportation needs - non-medical: Not on file  Occupational History  . Not on file  Tobacco Use  . Smoking status: Former Games developermoker  . Smokeless tobacco: Never Used  Substance and Sexual Activity  . Alcohol use: No  . Drug use: No  . Sexual activity: Not on file  Other Topics Concern  . Not on file  Social History Narrative  . Not on file    Review of Systems: A complete ROS was negative except as per HPI.   Physical Exam: Blood pressure 120/64, pulse (!) 107, temperature 98.7 F (37.1 C), temperature source Rectal, resp. rate 20, weight 130 lb 1.1 oz (59 kg), SpO2  98 %. Physical Exam GENERAL- thin, frail, cachetic appearing male. Resting comfortably in bed in no acute distress. HEENT- Atraumatic, normocephalic, PERRL, EOMI, oral mucosa appears dry CARDIAC- tachycardic, no murmurs, rubs or gallops. RESP- Moving equal volumes of air, normal effort, diffuse rhonchi throughout, no wheezes ABDOMEN- Soft, nontender, bowel sounds present. NEURO- awake, follows simple commands, significant dysarthria, flexion contractures of his LUE and RUE.  MSK - temporal wasting with diffuse muscle wasting SKIN- Warm, dry. Several stage II sacral ulcers. No erythema, warmth or discharge.  PSYCH- Unable to assess  EKG: personally reviewed my interpretation is sinus tachycardia, PACs, prolonged QTc  CXR: personally reviewed my interpretation is bibasilar infiltrates most prominent on the right.  Assessment & Plan by Problem:  Aspiration PNA 2/2 Dysphagia from multiple previous CVAs: Patient with prolonged hospital courses at [facility] in December for similar. He was treated with a 10 day course of Zosyn on initial hospitalization followed by another 7 day course on subsequent re-hospitalization. Appeared to respond well to treatment on both admissions with clinical improvement in symptoms as well as resolution of white count and oxygen requirement. No fever or leukocytosis at time of discharge on 09/19/17. He remains on a pureed diet and has difficulties with oral secretions per prior SLP evaluation. Family and patient declined feeding tube placement. He has no increased work of breathing, oxygen requirement or fever here today but does have an increased white count of 23K and is mildly tachycardic. No other obvious source of infection. Sacral ulcers are clean and dry and do not appear infected. He does not make urine. No GI complaints or diarrhea. CXR with persistent opacities though improving. His pro-calcitonin is chronically elevated likely 2/2 ESRD. Suspect he has continued  aspiration events leading to further aspiration PNA. Will re-start Zosyn here. MRSA nasal swab at prior hospitalization was negative. Will re-check here. Unfortunately he will continue to have aspiration events moving forward. He and his wife have had discussions with palliative care at his prior hospitalizations. He continues to wish to be full code. Discussions about home hospice care were limited as he does not wish to stop dialysis which would be necessary for him to go on hospice care. Will consult palliative care here and continue to have ongoing goals of care discussions with patient and family.   Hypokalemia and Hypophosphatemia: K 2.2 on arrival. S/p 2 runs of IV K in the ED. Improved to 2.5 on re-check. Will give another 2 runs IV. Nephrology consulted. Phos 1.3. Patient unable to tolerate PO. Will give IV phos here. Repeat in am.   ESRD on HD T,Th,S: Nephrology consulted. Appreciated assistance.  Severe protein-calorie malnutrition: Patient with severe muscle wasting and temporal wasting. Albumin 1.7. His numerous electrolyte abnormalities are  likely related to his poor nutritional status. Will consult nutrition for assistance.   PAD: Noted to have a cold right leg during prior hospitalization on 12/12. CT angiogram of abdominal aorta done a that time revealed embolic disease involving the left anterior tibial artery with reconstitution of the left dorsalis pedis at hte level of the forefoot. Also a short segment of near occlusive embolism to the left infrageniculate artery with near occlusive embolism involving the distal aspect of the right common femoral artery. TTE showed EF of 50-55%. Evaluated by vascular surgery and not felt to be a candidate for interventions. He was initially started on Warfarin but had poor follow up outpatient and INR on re-admission was supra-therapetuc. He was transitioned to Eliquis prior to discharge.   Stage II Sacral Pressure Ulcers: Clean, dry. Do not  appear infected. Wound care consulted. Appreciate assistance.   Dispo: Admit patient to Inpatient with expected length of stay greater than 2 midnights.  Signed: Valentino Nose, MD 09/22/2017, 8:37 AM  Pager: [phone]

## 2017-09-22 NOTE — Progress Notes (Signed)
New Admission Note:   Arrival Method: Bed Mental Orientation: Alert Telemetry: Initiated Assessment: Completed Skin: See Flowsheets IV: WDL Pain: 0 Admission: To be complete with family at bedside  Orders have been reviewed and implemented. Will continue to monitor the patient. Call light has been placed within reach and bed alarm has been activated.    Britt BologneseAnisha Mabe RN, BSN

## 2017-09-22 NOTE — ED Notes (Signed)
pts family gone home. Family states they will be back later.

## 2017-09-22 NOTE — Progress Notes (Signed)
Pharmacy Antibiotic Note  Marlis EdelsonMichael A Buczek is a 62 y.o. male admitted on 09/22/2017 with HCAP.  Pharmacy has been consulted for Vancomycin dosing.  PT with ESRD, HD on TTSat  Plan: Vancomycin 1 g IV now, then vancomycin 500 mg IV after each HD  Weight: 130 lb 1.1 oz (59 kg)  Temp (24hrs), Avg:98.5 F (36.9 C), Min:98.3 F (36.8 C), Max:98.7 F (37.1 C)  Recent Labs  Lab 09/22/17 0513  WBC 23.6*  CREATININE 2.57*  LATICACIDVEN 2.09*    CrCl cannot be calculated (Unknown ideal weight.).    Allergies  Allergen Reactions  . Zofran [Ondansetron Hcl] Nausea And Vomiting   Eddie Candlebbott, [place] 09/22/2017 7:43 AM

## 2017-09-22 NOTE — ED Notes (Signed)
Admitting MD at bedside, speaking with family.

## 2017-09-22 NOTE — Progress Notes (Signed)
SLP Cancellation Note  Patient Details Name: Kenneth Mckenzie MRN: [id number] DOB: Sep 02, 1956   Cancelled treatment:       Reason Eval/Treat Not Completed: Fatigue/lethargy limiting ability to participate; Family declined   Tressie StalkerPat Brysin Towery, M.S., CCC-SLP 09/22/2017, 1:54 PM

## 2017-09-22 NOTE — ED Provider Notes (Addendum)
[facility] Provider Note   CSN: [id number] Arrival date & time: 09/22/17  0451  Time seen 04:25 AM   History   Chief Complaint Chief Complaint  Patient presents with  . Shortness of Breath   Level 5 caveat for patient being nonverbal  HPI Kenneth Mckenzie is a 62 y.o. male.  HPI per EMS patient was discharged from [facility] about 2 days ago after a admission for pneumonia.  He states he had been there a while.  Family called EMS tonight because they state he was not breathing normally.  On their arrival his pulse ox was 90% on room air.  He states the patient felt hot to touch and his temperature was 100.4 after several attempts.  He states his lungs sounded congested and he had diffuse wheezing.  He has had albuterol 10 mg plus Atrovent 0.5 mg nebulizer treatments done.  He states per family patient has had multiple strokes and is bedbound.  He has dysarthria.  They also reported he was done with his antibiotics for the pneumonia.  Patient also has end-stage renal disease and has a access in his chest.  He does not know what days the patient gets dialysis.  Looking at his records it appears he was admitted on December 25 to [facility].  PCP No primary care provider on file.   Past Medical History:  Diagnosis Date  . Borderline diabetic   . Coronary heart disease   . Hypertension   . Renal disease   . Stroke [facility](HCC)     Patient Active Problem List   Diagnosis Date Noted  . Acute upper GI bleed 05/29/2016  . Gastritis and gastroduodenitis 05/29/2016  . Aphasia complicating stroke 05/29/2016  . ESRD on dialysis (HCC) 05/29/2016  . Hyperlipidemia associated with type 2 diabetes mellitus (HCC) 05/29/2016  . Glaucoma due to secondary diabetes (HCC) 05/29/2016  . DM type 2 causing ESRD (HCC)   . Hypertension associated with stage 5 chronic kidney disease due to type 2 diabetes mellitus (HCC)   . Renal  disease   . Coronary heart disease   . CVA (cerebrovascular accident) [facility](HCC)     Past Surgical History:  Procedure Laterality Date  . Open Heart Surgery         Home Medications    Prior to Admission medications   Medication Sig Start Date End Date Taking? Authorizing Provider  aspirin EC 81 MG tablet Take 81 mg by mouth daily.    [provider]  carvedilol (COREG) 25 MG tablet Take 25 mg by mouth 2 (two) times daily with a meal.    [provider]  insulin lispro (HUMALOG) 100 UNIT/ML injection Inject into the skin 3 (three) times daily before meals. Use Sliding scale 70 - 120 = 0 units, 121 - 150 = 1 units; 151 - 200 = 2 units, 201 - 250 = 3 units, 251 - 300 = 5 units, 301 - 350 = 7 units, 351 - 400 = 9 units; Greater than 400 call MD and give 9 units    [provider]  lisinopril (PRINIVIL,ZESTRIL) 40 MG tablet Take 40 mg by mouth daily.    [provider]  pantoprazole (PROTONIX) 40 MG tablet Take 40 mg by mouth 2 (two) times daily. Take twice daily for 6 weeks, then take 1 daily.    [provider]  simvastatin (ZOCOR) 20 MG tablet Take 20 mg by mouth at  bedtime.    [provider]  timolol (BETIMOL) 0.5 % ophthalmic solution Place 1 drop into both eyes 2 (two) times daily.    [provider]    Family History Family History  Problem Relation Age of Onset  . Diabetes Mother   . Hypertension Mother   . Diabetes Father   . Hypertension Father     Social History Social History   Tobacco Use  . Smoking status: Former Games developer  . Smokeless tobacco: Never Used  Substance Use Topics  . Alcohol use: No  . Drug use: No     Allergies   Zofran [ondansetron hcl]   Review of Systems Review of Systems  Unable to perform ROS: Patient nonverbal     Physical Exam Updated Vital Signs BP (!) 149/84   Pulse (!) 108   Temp 98.7 F (37.1 C) (Rectal)   Resp (!) 26   Wt 59 kg (130 lb 1.1 oz)   SpO2 97%   BMI  16.26 kg/m   Physical Exam  Constitutional:  Thin male  HENT:  Head: Normocephalic and atraumatic.  Right Ear: External ear normal.  Left Ear: External ear normal.  Nose: Nose normal.  Will open his mouth to command, tongue looks dry  Eyes: EOM are normal. Pupils are equal, round, and reactive to light.  Neck:  He has limited ROM of his neck  Cardiovascular: Regular rhythm. Tachycardia present.  Pulmonary/Chest: Effort normal. No accessory muscle usage. He has decreased breath sounds. He has rhonchi.  Pt is on an nebulizer started by EMS on arrival  Musculoskeletal:  Pt has  Flexion contractures of his  LUE, he holds his RUE onto his abdomen, he does not flex his knees.  Neurological:  Pt is awake, he will open his mouth to command, he tries to speak, but has dysarthria  Skin: Skin is warm and dry. No ecchymosis and no rash noted.  Psychiatric:  Unable to assess  Nursing note and vitals reviewed.    ED Treatments / Results  Labs (all labs ordered are listed, but only abnormal results are displayed)   Results for orders placed or performed during the hospital encounter of 09/22/17  Comprehensive metabolic panel  Result Value Ref Range   Sodium 139 135 - 145 mmol/L   Potassium 2.2 (LL) 3.5 - 5.1 mmol/L   Chloride 96 (L) 101 - 111 mmol/L   CO2 24 22 - 32 mmol/L   Glucose, Bld 88 65 - 99 mg/dL   BUN 5 (L) 6 - 20 mg/dL   Creatinine, Ser 1.61 (H) 0.61 - 1.24 mg/dL   Calcium 7.9 (L) 8.9 - 10.3 mg/dL   Total Protein 7.8 6.5 - 8.1 g/dL   Albumin 1.7 (L) 3.5 - 5.0 g/dL   AST 23 15 - 41 U/L   ALT 10 (L) 17 - 63 U/L   Alkaline Phosphatase 129 (H) 38 - 126 U/L   Total Bilirubin 1.6 (H) 0.3 - 1.2 mg/dL   GFR calc non Af Amer 25 (L) >60 mL/min   GFR calc Af Amer 29 (L) >60 mL/min   Anion gap 19 (H) 5 - 15  CBC with Differential  Result Value Ref Range   WBC 23.6 (H) 4.0 - 10.5 K/uL   RBC 3.31 (L) 4.22 - 5.81 MIL/uL   Hemoglobin 9.3 (L) 13.0 - 17.0 g/dL   HCT 09.6 (L) 04.5  - 52.0 %   MCV 91.8 78.0 - 100.0 fL   MCH 28.1 26.0 -  34.0 pg   MCHC 30.6 30.0 - 36.0 g/dL   RDW 16.1 (H) 09.6 - 04.5 %   Platelets 367 150 - 400 K/uL   Neutrophils Relative % 88 %   Neutro Abs 20.6 (H) 1.7 - 7.7 K/uL   Lymphocytes Relative 4 %   Lymphs Abs 1.0 0.7 - 4.0 K/uL   Monocytes Relative 8 %   Monocytes Absolute 1.8 (H) 0.1 - 1.0 K/uL   Eosinophils Relative 0 %   Eosinophils Absolute 0.0 0.0 - 0.7 K/uL   Basophils Relative 0 %   Basophils Absolute 0.1 0.0 - 0.1 K/uL  Protime-INR  Result Value Ref Range   Prothrombin Time 17.2 (H) 11.4 - 15.2 seconds   INR 1.42   I-Stat CG4 Lactic Acid, ED  Result Value Ref Range   Lactic Acid, Venous 2.09 (HH) 0.5 - 1.9 mmol/L   Comment NOTIFIED PHYSICIAN    Laboratory interpretation all normal except very low potassium, low chloride, chronic renal failure however his creatinine is under 3, mildly elevated lactic acid, marked leukocytosis with left shift     EKG  EKG Interpretation None      ED ECG REPORT   Date: 09/22/2017  Rate: 117  Rhythm: sinus tachycardia  QRS Axis: right  Intervals: QT prolonged  ST/T Wave abnormalities: nonspecific ST changes  Conduction Disutrbances:nonspecific intraventricular conduction delay  Narrative Interpretation:   Old EKG Reviewed: none available  I have personally reviewed the EKG tracing and agree with the computerized printout as noted.   Radiology Dg Chest Port 1 View  Result Date: 09/22/2017 CLINICAL DATA:  Initial evaluation for acute shortness of breath, recent pneumonia. EXAM: PORTABLE CHEST 1 VIEW COMPARISON:  Prior radiograph from 09/12/2017. FINDINGS: Left-sided central venous catheter in place, stable. Median sternotomy wires noted. Cardiac and mediastinal silhouettes are stable from previous, and remain within normal limits. Lungs mildly hypoinflated. Interval improvement in previously seen left basilar airspace disease. Right basilar infiltrates persist, but are also  slightly improved. There is a persistent right pleural effusion. No pulmonary edema. No pneumothorax. No acute osseous abnormality. IMPRESSION: 1. Persistent but improved bibasilar infiltrates as compared to previous, most prominent on the right. Persistent small right pleural effusion. 2. No other new active cardiopulmonary disease. Electronically Signed   By: Rise Mu M.D.   On: 09/22/2017 05:20    Procedures Procedures (including critical care time)  Medications Ordered in ED Medications  clindamycin (CLEOCIN) IVPB 600 mg (not administered)  vancomycin (VANCOCIN) IVPB 1000 mg/200 mL premix (not administered)  potassium chloride 10 mEq in 100 mL IVPB (not administered)  sodium chloride 0.9 % bolus 500 mL (not administered)  ceFEPIme (MAXIPIME) 2 g in dextrose 5 % 50 mL IVPB (2 g Intravenous New Bag/Given 09/22/17 4098)     Initial Impression / Assessment and Plan / ED Course  I have reviewed the triage vital signs and the nursing notes.  Pertinent labs & imaging results that were available during my care of the patient were reviewed by me and considered in my medical decision making (see chart for details).     Review of care everywhere shows that he had pneumonia bilaterally.  He was admitted December 25 and discharged January 1.  They report he is admitted frequently for aspiration pneumonia.  He had been admitted also from December 8-19.  He did require intubation and did have sepsis requiring pressors at that time.  He was noted to have an ischemic leg on December 12 and CTA of  the abdomen showed embolic disease of the left anterior tibial artery and near occlusive embolism of the left infrafeniculate artery, near occlusive embolism involving the distal aspect of the right common femoral artery extending deep to the proximal aspect of the right superficial and deep femoral arteries.  He had a TEE done which showed ejection fraction of 50-55%.  He was not felt to be a candidate  for surgical intervention by vascular and was treated with anticoagulation.  He is currently on Eliquis.  He was discharged on Coumadin.  Evidently family did not want a feeding tube.  They report he gets dialysis on Tuesday, Thursday, and Saturdays.  Due to history of frequent aspiration pneumonia patient was started on antibiotics.  After reviewing his laboratory tests patient had EKG ordered to evaluate his hypokalemia.  I will give the patient a couple of runs of IV potassium however he has chronic renal failure so I will not be too aggressive about treating that.  6:50 AM wife is here now, she states patient was dialyzed yesterday, January 3.  She states she did not have them pull off as much fluid because he is not been eating and drinking as well as he normally does.  She states she feels like he still has the pneumonia because she still hears a rattling in his chest.  She states he has had a problem with low potassium ever since he started having his renal failure.  She is agreeable for admission.  He was given IV fluids slowly due to his underlying end-stage renal disease.  07:26 AM Dr Karma Greaser, admitting internal medicine resident will admit  Final Clinical Impressions(s) / ED Diagnoses   Final diagnoses:  Healthcare-associated pneumonia  Hypokalemia  ESRD (end stage renal disease) [facility])    Plan admission  Devoria Albe, MD, Concha Pyo, MD 09/22/17 1610    Devoria Albe, MD 09/22/17 [phone]

## 2017-09-22 NOTE — ED Triage Notes (Signed)
Pt arrived from home c/o sob. Family called EMS tonight for pt having irregular breathing. EMS reports congestion with wheezing. Pt given duoneb with additional albuterol neb treatments. Pt is bedbound from multiple previous strokes. Denies pain at present. Also recently discharged from [facility] for pneumonia

## 2017-09-22 NOTE — Evaluation (Signed)
Physical Therapy Evaluation Patient Details Name: Kenneth Mckenzie MRN: [id number] DOB: [birth year] Today's Date: 09/22/2017   History of Present Illness  Pt is a 62 y/o male admitted secondary to HCAP, hypokalemia and ESRD. PMH including but not limited to HTN, HLD, DM, ESRD and prior CVAs.    Clinical Impression  Pt presented supine in bed with HOB elevated, lethargic initially and throughout session. [name] and children were present and provided all information regarding home environment and PLOF. Prior to admission, pt was dependent for ADLs and his family uses a Nurse, adult for transfers. [name] reported that he has a manual w/c that he can propel himself somewhat using bilateral LEs. Pt currently very limited secondary to lethargy. Pt not participating or actively moving any extremity during assessment. Pt's family would like for him to return home with [facility] therapies and an aide. Will trial PT acutely for further assessment when pt is more alert. Pt would continue to benefit from skilled physical therapy services at this time while admitted and after d/c to address the below listed limitations in order to improve overall safety and independence with functional mobility.     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 Restrictions Weight Bearing Restrictions: No      Mobility  Bed Mobility Overal bed mobility: Needs Assistance             General bed mobility comments: total A  Transfers                 General transfer comment: unable to perform this session secondary to pt's arousal level and unsafe with only one therapist  Ambulation/Gait                Stairs            Wheelchair  Mobility    Modified Rankin (Stroke Patients Only)       Balance                                             Pertinent Vitals/Pain Pain Assessment: Faces Faces Pain Scale: Hurts even more Pain Location: generalized Pain Descriptors / Indicators: Grimacing Pain Intervention(s): Monitored during session    Home Living Family/patient expects to be discharged to:: Private residence Living Arrangements: Spouse/significant other;Children Available Help at Discharge: Family;Available 24 hours/day Type of Home: House Home Access: Ramped entrance     Home Layout: One level Home Equipment: Wheelchair - manual;Hospital bed;Other (comment)(Hoyer Lift)      Prior Function Level of Independence: Needs assistance   Gait / Transfers Assistance Needed: family uses lift for transfers; pt has w/c and can propel himself some with bilateral LEs per wife  ADL's / Homemaking Assistance Needed: dependent        Hand Dominance        Extremity/Trunk Assessment   Upper Extremity Assessment Upper Extremity Assessment: Generalized weakness;RUE deficits/detail;LUE deficits/detail RUE Deficits / Details: increased tone throughout with PROM restrictions at shoulder (limited to ~90 degrees of flexion) and at elbow (cannot achieve full extension); tightness in hands, wrist and fingers as well RUE Coordination: decreased fine motor;decreased gross motor  LUE Deficits / Details: pt with flexion contractures at elbow and wrist from previous CVA LUE Coordination: decreased fine motor;decreased gross motor    Lower Extremity Assessment Lower Extremity Assessment: RLE deficits/detail;LLE deficits/detail RLE Deficits / Details: pt with no active movement throughout assessment; PROM limitations at knee (cannot achieve full extension) and ankle (DF limited to neutral); pt with preference to maintain R LE in hip IR, hip adduction and flexion, as well as knee flexion RLE Coordination:  decreased fine motor;decreased gross motor LLE Deficits / Details: pt with no active movement throughout assessment; PROM limitations at knee (cannot achieve full extension) and ankle (DF limited to neutral); pt with preference to maintain R LE in hip adduction and flexion, as well as knee flexion LLE Coordination: decreased fine motor;decreased gross motor    Cervical / Trunk Assessment Cervical / Trunk Assessment: Other exceptions Cervical / Trunk Exceptions: pt with preference for maintaining head in L cervical rotation in supine  Communication   Communication: Expressive difficulties  Cognition Arousal/Alertness: Lethargic Behavior During Therapy: Flat affect Overall Cognitive Status: History of cognitive impairments - at baseline                                        General Comments      Exercises     Assessment/Plan    PT Assessment Patient needs continued PT services  PT Problem List Decreased strength;Decreased range of motion;Decreased activity tolerance;Decreased balance;Decreased mobility;Decreased coordination;Decreased cognition;Decreased safety awareness;Decreased knowledge of precautions;Pain       PT Treatment Interventions DME instruction;Functional mobility training;Therapeutic activities;Therapeutic exercise;Balance training;Neuromuscular re-education;Patient/family education    PT Goals (Current goals can be found in the Care Plan section)  Acute Rehab PT Goals Patient Stated Goal: unable to state; pt's wife wants him to return home and [facility] PT Goal Formulation: With family Time For Goal Achievement: 10/06/17 Potential to Achieve Goals: Fair    Frequency Min 2X/week(PT trial as family wants HHPT)   Barriers to discharge        Co-evaluation               AM-PAC PT "6 Clicks" Daily Activity  Outcome Measure Difficulty turning over in bed (including adjusting bedclothes, sheets and blankets)?:  Unable Difficulty moving from lying on back to sitting on the side of the bed? : Unable Difficulty sitting down on and standing up from a chair with arms (e.g., wheelchair, bedside commode, etc,.)?: Unable Help needed moving to and from a bed to chair (including a wheelchair)?: Total Help needed walking in hospital room?: Total Help needed climbing 3-5 steps with a railing? : Total 6 Click Score: 6    End of Session   Activity Tolerance: Patient limited by lethargy;Patient limited by fatigue Patient left: in bed;with call bell/phone within reach;with family/visitor present Nurse Communication: Mobility status;Need for lift equipment PT Visit Diagnosis: Other abnormalities of gait and mobility (R26.89);Muscle weakness (generalized) (M62.81);Other symptoms and signs involving the nervous system (R29.898)    Time: 1015-1024 PT Time Calculation (min) (ACUTE ONLY): 9 min   Charges:   PT Evaluation $PT Eval Low Complexity: 1 Low     PT G Codes:        RiberaJennifer Cono Gebhard, South CarolinaPT, DPT [phone]   Alessandra BevelsJennifer M Alliya Marcon 09/22/2017, 12:04 PM

## 2017-09-22 NOTE — Consult Note (Addendum)
WOC Nurse wound consult note Reason for Consult: Consult requested for buttocks Wound type: Pt has multiple patchy areas of partial thickness skin loss to bilat buttocks; affected area is approx 10X10 cm.  Wounds are all .5X.5X.2cm or smaller, round and red, moist with small amt yellow drainage, no odor.  Appearance is consistent with moisture associated skin damage. Left upper buttock with 2X1cm dark purple-red deep tissue injury Pressure Injury POA: Yes Dressing procedure/placement/frequency: Pt is frequently incontinent of loose stool and it is difficult to keep the locations from becoming soiled.  Family at the bedside to assess the sites and discuss plan of care.  Foam dressing to protect and promote healing. Please re-consult if further assistance is needed.  Thank-you,  Cammie Mcgeeawn Tecia Cinnamon MSN, RN, CWOCN, NelsonWCN-AP, CNS [phone]

## 2017-09-22 NOTE — Progress Notes (Signed)
Palliative Medicine consult noted. Due to high referral volume, there may be a delay seeing this patient. Please call the Palliative Medicine Team office at [phone] if recommendations are needed in the interim.  Thank you for inviting us to see this patient.  Philbert Ocallaghan G. Mathieu Schloemer, RN, BSN, CHPN 09/22/2017 2:05 PM Office [phone] 

## 2017-09-22 NOTE — Consult Note (Signed)
Reason for Consult: ESRD Referring Physician:  Dr. Heber Hugo  Chief Complaint: Dyspnea  Dialysis orders: TTS @ [facility] T4hr [facility] TC EDW 60.5kg (but left at 58.6kg last HD) - last HD 09/21/17 3K/2.25  Hecotorol 63mg IV TIW Heparin 4000 unit bolus Mircera 50 q2weeks (last given 09/21/17)   Assessment/Plan: 1. Pneumonia w/ leukocytosis - tx w/ broad spectrum Abx; has had multiple bouts with last treatment @ [facility] in late Dec. 2. ESRD - TTS --> will plan on HD in the AM. Not sure with the hospital bed weights as he's only 0.4kg above post HD weight on 1/3 (he left below EDW on 1/3). Will attempt 1 L as tolerated with close monitoring. - Usually on 3K bath but will use 4K in am. Related to malnutrition -> will check a phos as well which may be low. 3. 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 Malnutrition 5. Disp - Agree with palliative care consult; very unfortunate case.   HPI: Kenneth STANGELOis an 62y.o. [facility] (60.5kg) @ 58.6kg. He has had multiple CVA's -> recurrent aspiration + PNA treated at different occasions, [facility] He was brought to the ED bec he has been getting more dyspneic. He was found to have b/l infiltrates and leukocytosis in the ED. He also nods yes when asked if he has a cough but denies f/c/n/v/dypnea/cp/diarrhea. He also nods yes to a very poor appetite.  ROS Pertinent items are noted in HPI.  Chemistry and CBC: Creatinine  Date/Time Value Ref Range Status  05/21/2016 6.1 (A) 0.6 - 1.3 mg/dL Final  05/19/2016 7.4 (A) 0.6 - 1.3 mg/dL Final  05/17/2016 8.3 (A) 0.6 - 1.3 mg/dL Final  05/16/2016 03:29 AM 6.3 (A) 0.6 - 1.3 mg/dL Final  05/15/2016 03:29 AM 4.3 (A) 0.6 - 1.3 mg/dL Final   Creatinine, Ser  Date/Time Value Ref Range Status  09/22/2017 12:08 PM 2.86 (H) 0.61 - 1.24 mg/dL Final  09/22/2017 05:13 AM 2.57 (H) 0.61 - 1.24 mg/dL  Final   Recent Labs  Lab 09/22/17 0513 09/22/17 1208  NA 139 138  K 2.2* 2.5*  CL 96* 98*  CO2 24 27  GLUCOSE 88 152*  BUN 5* 8  CREATININE 2.57* 2.86*  CALCIUM 7.9* 7.5*  PHOS  --  1.3*   Recent Labs  Lab 09/22/17 0513  WBC 23.6*  NEUTROABS 20.6*  HGB 9.3*  HCT 30.4*  MCV 91.8  PLT 367   Liver Function Tests: Recent Labs  Lab 09/22/17 0513  AST 23  ALT 10*  ALKPHOS 129*  BILITOT 1.6*  PROT 7.8  ALBUMIN 1.7*   No results for input(s): LIPASE, AMYLASE in the last 168 hours. No results for input(s): AMMONIA in the last 168 hours. Cardiac Enzymes: No results for input(s): CKTOTAL, CKMB, CKMBINDEX, TROPONINI in the last 168 hours. Iron Studies: No results for input(s): IRON, TIBC, TRANSFERRIN, FERRITIN in the last 72 hours. PT/INR: _0 (inr:5)  Xrays/Other Studies: ) Results for orders placed or performed during the hospital encounter of 09/22/17 (from the past 48 hour(s))  Comprehensive metabolic panel     Status: Abnormal   Collection Time: 09/22/17  5:13 AM  Result Value Ref Range   Sodium 139 135 - 145 mmol/L   Potassium 2.2 (LL) 3.5 - 5.1 mmol/L    Comment: CRITICAL RESULT CALLED TO, READ BACK BY AND VERIFIED  WITH: OLDLAND B,RN 09/22/17 0624 WAYK    Chloride 96 (L) 101 - 111 mmol/L   CO2 24 22 - 32 mmol/L   Glucose, Bld 88 65 - 99 mg/dL   BUN 5 (L) 6 - 20 mg/dL   Creatinine, Ser 2.57 (H) 0.61 - 1.24 mg/dL   Calcium 7.9 (L) 8.9 - 10.3 mg/dL   Total Protein 7.8 6.5 - 8.1 g/dL   Albumin 1.7 (L) 3.5 - 5.0 g/dL   AST 23 15 - 41 U/L   ALT 10 (L) 17 - 63 U/L   Alkaline Phosphatase 129 (H) 38 - 126 U/L   Total Bilirubin 1.6 (H) 0.3 - 1.2 mg/dL   GFR calc non Af Amer 25 (L) >60 mL/min   GFR calc Af Amer 29 (L) >60 mL/min    Comment: (NOTE) The eGFR has been calculated using the CKD EPI equation. This calculation has not been validated in all clinical situations. eGFR's persistently <60 mL/min signify possible Chronic Kidney Disease.    Anion  gap 19 (H) 5 - 15  I-Stat CG4 Lactic Acid, ED     Status: Abnormal   Collection Time: 09/22/17  5:13 AM  Result Value Ref Range   Lactic Acid, Venous 2.09 (HH) 0.5 - 1.9 mmol/L   Comment NOTIFIED PHYSICIAN   CBC with Differential     Status: Abnormal   Collection Time: 09/22/17  5:13 AM  Result Value Ref Range   WBC 23.6 (H) 4.0 - 10.5 K/uL   RBC 3.31 (L) 4.22 - 5.81 MIL/uL   Hemoglobin 9.3 (L) 13.0 - 17.0 g/dL   HCT 30.4 (L) 39.0 - 52.0 %   MCV 91.8 78.0 - 100.0 fL   MCH 28.1 26.0 - 34.0 pg   MCHC 30.6 30.0 - 36.0 g/dL   RDW 15.7 (H) 11.5 - 15.5 %   Platelets 367 150 - 400 K/uL   Neutrophils Relative % 88 %   Neutro Abs 20.6 (H) 1.7 - 7.7 K/uL   Lymphocytes Relative 4 %   Lymphs Abs 1.0 0.7 - 4.0 K/uL   Monocytes Relative 8 %   Monocytes Absolute 1.8 (H) 0.1 - 1.0 K/uL   Eosinophils Relative 0 %   Eosinophils Absolute 0.0 0.0 - 0.7 K/uL   Basophils Relative 0 %   Basophils Absolute 0.1 0.0 - 0.1 K/uL  Protime-INR     Status: Abnormal   Collection Time: 09/22/17  5:13 AM  Result Value Ref Range   Prothrombin Time 17.2 (H) 11.4 - 15.2 seconds   INR 1.42   Magnesium     Status: None   Collection Time: 09/22/17  7:09 AM  Result Value Ref Range   Magnesium 1.7 1.7 - 2.4 mg/dL  I-Stat CG4 Lactic Acid, ED     Status: Abnormal   Collection Time: 09/22/17  8:11 AM  Result Value Ref Range   Lactic Acid, Venous 2.94 (HH) 0.5 - 1.9 mmol/L   Comment NOTIFIED PHYSICIAN   Lactic acid, plasma     Status: None   Collection Time: 09/22/17 11:58 AM  Result Value Ref Range   Lactic Acid, Venous 1.7 0.5 - 1.9 mmol/L  Basic metabolic panel     Status: Abnormal   Collection Time: 09/22/17 12:08 PM  Result Value Ref Range   Sodium 138 135 - 145 mmol/L   Potassium 2.5 (LL) 3.5 - 5.1 mmol/L    Comment: CRITICAL RESULT CALLED TO, READ BACK BY AND VERIFIED WITH: K.PATE,RN 09/22/17 1323 BY BSLADE  Chloride 98 (L) 101 - 111 mmol/L   CO2 27 22 - 32 mmol/L   Glucose, Bld 152 (H) 65 - 99  mg/dL   BUN 8 6 - 20 mg/dL   Creatinine, Ser 2.86 (H) 0.61 - 1.24 mg/dL   Calcium 7.5 (L) 8.9 - 10.3 mg/dL   GFR calc non Af Amer 22 (L) >60 mL/min   GFR calc Af Amer 26 (L) >60 mL/min    Comment: (NOTE) The eGFR has been calculated using the CKD EPI equation. This calculation has not been validated in all clinical situations. eGFR's persistently <60 mL/min signify possible Chronic Kidney Disease.    Anion gap 13 5 - 15  Phosphorus     Status: Abnormal   Collection Time: 09/22/17 12:08 PM  Result Value Ref Range   Phosphorus 1.3 (L) 2.5 - 4.6 mg/dL   Dg Chest Port 1 View  Result Date: 09/22/2017 CLINICAL DATA:  Initial evaluation for acute shortness of breath, recent pneumonia. EXAM: PORTABLE CHEST 1 VIEW COMPARISON:  Prior radiograph from 09/12/2017. FINDINGS: Left-sided central venous catheter in place, stable. Median sternotomy wires noted. Cardiac and mediastinal silhouettes are stable from previous, and remain within normal limits. Lungs mildly hypoinflated. Interval improvement in previously seen left basilar airspace disease. Right basilar infiltrates persist, but are also slightly improved. There is a persistent right pleural effusion. No pulmonary edema. No pneumothorax. No acute osseous abnormality. IMPRESSION: 1. Persistent but improved bibasilar infiltrates as compared to previous, most prominent on the right. Persistent small right pleural effusion. 2. No other new active cardiopulmonary disease. Electronically Signed   By: Jeannine Boga M.D.   On: 09/22/2017 05:20    PMH:   Past Medical History:  Diagnosis Date  . Borderline diabetic   . Coronary heart disease   . Hypertension   . Renal disease   . Stroke [facility])     PSH:   Past Surgical History:  Procedure Laterality Date  . Open Heart Surgery      Allergies:  Allergies  Allergen Reactions  . Zofran [Ondansetron Hcl] Nausea And Vomiting    Medications:   Prior to Admission medications   Medication Sig  Start Date End Date Taking? Authorizing Provider  apixaban (ELIQUIS) 2.5 MG TABS tablet Take 2.5 mg by mouth 2 (two) times daily.   Yes [provider]  metoprolol tartrate (LOPRESSOR) 25 MG tablet Take 12.5 mg by mouth 2 (two) times daily.   Yes [provider]  timolol (BETIMOL) 0.5 % ophthalmic solution Place 1 drop into both eyes 2 (two) times daily.   Yes [provider]  lisinopril (PRINIVIL,ZESTRIL) 40 MG tablet Take 40 mg by mouth daily.    [provider]    Discontinued Meds:   Medications Discontinued During This Encounter  Medication Reason  . simvastatin (ZOCOR) 20 MG tablet Completed Course  . pantoprazole (PROTONIX) 40 MG tablet Completed Course  . carvedilol (COREG) 25 MG tablet Completed Course  . aspirin EC 81 MG tablet   . insulin lispro (HUMALOG) 100 UNIT/ML injection Discontinued by provider  . clindamycin (CLEOCIN) IVPB 600 mg   . vancomycin (VANCOCIN) IVPB 1000 mg/200 mL premix   . vancomycin (VANCOCIN) 500 mg in sodium chloride 0.9 % 100 mL IVPB     Social History:  reports that he has quit smoking. he has never used smokeless tobacco. He reports that he does not drink alcohol or use drugs.  Family History:   Family History  Problem Relation Age of  Onset  . Diabetes Mother   . Hypertension Mother   . Diabetes Father   . Hypertension Father     Blood pressure 128/67, pulse 85, temperature 98.7 F (37.1 C), temperature source Rectal, resp. rate 15, weight 59 kg (130 lb 1.1 oz), SpO2 100 %. General appearance: Answers some questions and appears to be appropriate by nodding or shaking head Head: Normocephalic, without obvious abnormality, atraumatic Eyes: negative Neck: no adenopathy, no carotid bruit, supple, symmetrical, trachea midline and thyroid not enlarged, symmetric, no tenderness/mass/nodules Back: symmetric, no curvature. ROM normal. No CVA tenderness. Resp: poor air movement Chest wall: no tenderness Cardio:  regular rate and rhythm, S1, S2 normal, no murmur, click, rub or gallop GI: soft, non-tender; bowel sounds normal; no masses,  no organomegaly Extremities: extremities normal, atraumatic, no cyanosis or edema Pulses: 2+ and symmetric Skin: Skin color, texture, turgor normal. No rashes or lesions Lymph nodes: Cervical, supraclavicular, and axillary nodes normal. Neurologic: Motor: Contractures       Dwana Melena, MD 09/22/2017, 6:29 PM

## 2017-09-22 NOTE — Progress Notes (Signed)
Pharmacy Antibiotic Note  Marlis EdelsonMichael A Maresh is a 62 y.o. male admitted on 09/22/2017 with aspiration PNA.  Pharmacy has been consulted for zosyn dosing. CrCl<20. Received dose of cefepime this AM at ~0630. Vancomycin d/c'd  Plan: Zosyn 2.25g IV q8h Monitor clinical progress, c/s, renal function F/u de-escalation plan/LOT   Weight: 130 lb 1.1 oz (59 kg)  Temp (24hrs), Avg:98.5 F (36.9 C), Min:98.3 F (36.8 C), Max:98.7 F (37.1 C)  Recent Labs  Lab 09/22/17 0513 09/22/17 0811 09/22/17 1158 09/22/17 1208  WBC 23.6*  --   --   --   CREATININE 2.57*  --   --  2.86*  LATICACIDVEN 2.09* 2.94* 1.7  --     CrCl cannot be calculated (Unknown ideal weight.).    Allergies  Allergen Reactions  . Zofran [Ondansetron Hcl] Nausea And Vomiting   Babs BertinHaley Debany Vantol, PharmD, BCPS Clinical Pharmacist 09/22/2017 2:15 PM

## 2017-09-22 NOTE — ED Notes (Signed)
Moved patient over to a hospital bed patient is resting with call bell in reach 

## 2017-09-22 NOTE — ED Notes (Signed)
Patient family states patient needs IV team.

## 2017-09-22 NOTE — ED Notes (Signed)
Pt placed on hospital bed, repositioned in bed.

## 2017-09-22 NOTE — ED Notes (Signed)
MD advised give 50 cc NS

## 2017-09-23 ENCOUNTER — Other Ambulatory Visit: Payer: Self-pay

## 2017-09-23 ENCOUNTER — Encounter (HOSPITAL_COMMUNITY): Payer: Self-pay

## 2017-09-23 DIAGNOSIS — Z7901 Long term (current) use of anticoagulants: Secondary | ICD-10-CM

## 2017-09-23 DIAGNOSIS — R791 Abnormal coagulation profile: Secondary | ICD-10-CM

## 2017-09-23 DIAGNOSIS — M6249 Contracture of muscle, multiple sites: Secondary | ICD-10-CM

## 2017-09-23 LAB — CBC
HCT: 26 % — ABNORMAL LOW (ref 39.0–52.0)
HEMATOCRIT: 28.3 % — AB (ref 39.0–52.0)
HEMOGLOBIN: 8.5 g/dL — AB (ref 13.0–17.0)
Hemoglobin: 7.9 g/dL — ABNORMAL LOW (ref 13.0–17.0)
MCH: 26.9 pg (ref 26.0–34.0)
MCH: 27.3 pg (ref 26.0–34.0)
MCHC: 30 g/dL (ref 30.0–36.0)
MCHC: 30.4 g/dL (ref 30.0–36.0)
MCV: 89.6 fL (ref 78.0–100.0)
MCV: 90 fL (ref 78.0–100.0)
PLATELETS: 328 10*3/uL (ref 150–400)
Platelets: 334 10*3/uL (ref 150–400)
RBC: 3.16 MIL/uL — ABNORMAL LOW (ref 4.22–5.81)
RBC: 2.89 MIL/uL — AB (ref 4.22–5.81)
RDW: 15.7 % — ABNORMAL HIGH (ref 11.5–15.5)
RDW: 15.9 % — AB (ref 11.5–15.5)
WBC: 18.3 10*3/uL — ABNORMAL HIGH (ref 4.0–10.5)
WBC: 19.4 10*3/uL — ABNORMAL HIGH (ref 4.0–10.5)

## 2017-09-23 LAB — RENAL FUNCTION PANEL
ANION GAP: 14 (ref 5–15)
Albumin: 1.5 g/dL — ABNORMAL LOW (ref 3.5–5.0)
Albumin: 1.5 g/dL — ABNORMAL LOW (ref 3.5–5.0)
Anion gap: 15 (ref 5–15)
BUN: 12 mg/dL (ref 6–20)
BUN: 11 mg/dL (ref 6–20)
CALCIUM: 7.8 mg/dL — AB (ref 8.9–10.3)
CALCIUM: 7.5 mg/dL — AB (ref 8.9–10.3)
CHLORIDE: 98 mmol/L — AB (ref 101–111)
CHLORIDE: 97 mmol/L — AB (ref 101–111)
CO2: 25 mmol/L (ref 22–32)
CO2: 25 mmol/L (ref 22–32)
CREATININE: 3.29 mg/dL — AB (ref 0.61–1.24)
Creatinine, Ser: 3.32 mg/dL — ABNORMAL HIGH (ref 0.61–1.24)
GFR calc Af Amer: 21 mL/min — ABNORMAL LOW (ref >60)
GFR calc non Af Amer: 18 mL/min — ABNORMAL LOW (ref >60)
GFR calc non Af Amer: 19 mL/min — ABNORMAL LOW (ref >60)
GFR, EST AFRICAN AMERICAN: 22 mL/min — AB (ref >60)
GLUCOSE: 131 mg/dL — AB (ref 65–99)
Glucose, Bld: 121 mg/dL — ABNORMAL HIGH (ref 65–99)
POTASSIUM: 3.1 mmol/L — AB (ref 3.5–5.1)
Phosphorus: 2 mg/dL — ABNORMAL LOW (ref 2.5–4.6)
Phosphorus: 2 mg/dL — ABNORMAL LOW (ref 2.5–4.6)
Potassium: 3 mmol/L — ABNORMAL LOW (ref 3.5–5.1)
SODIUM: 137 mmol/L (ref 135–145)
SODIUM: 137 mmol/L (ref 135–145)

## 2017-09-23 LAB — MAGNESIUM: Magnesium: 1.8 mg/dL (ref 1.7–2.4)

## 2017-09-23 LAB — HEPATITIS B SURFACE ANTIGEN: Hepatitis B Surface Ag: NEGATIVE

## 2017-09-23 LAB — POTASSIUM: POTASSIUM: 4.1 mmol/L (ref 3.5–5.1)

## 2017-09-23 LAB — PHOSPHORUS: Phosphorus: 2.3 mg/dL — ABNORMAL LOW (ref 2.5–4.6)

## 2017-09-23 MED ORDER — SODIUM CHLORIDE 0.9 % IV SOLN
INTRAVENOUS | Status: AC
  Administered 2017-09-24: via INTRAVENOUS

## 2017-09-23 MED ORDER — HEPARIN SODIUM (PORCINE) 1000 UNIT/ML DIALYSIS: 4000.0000 [IU] | Freq: Once | INTRAMUSCULAR | Status: DC

## 2017-09-23 MED ORDER — SODIUM CHLORIDE 0.9 % IV SOLN: 100.0000 mL | INTRAVENOUS | Status: DC | PRN

## 2017-09-23 MED ORDER — ALTEPLASE 2 MG IJ SOLR: 2.0000 mg | Freq: Once | INTRAMUSCULAR | Status: DC | PRN

## 2017-09-23 MED ORDER — HEPARIN SODIUM (PORCINE) 1000 UNIT/ML DIALYSIS: 1000.0000 [IU] | INTRAMUSCULAR | Status: DC | PRN

## 2017-09-23 MED ORDER — LIDOCAINE-PRILOCAINE 2.5-2.5 % EX CREA: 1.0000 "application " | TOPICAL_CREAM | CUTANEOUS | Status: DC | PRN

## 2017-09-23 MED ORDER — POTASSIUM PHOSPHATES 15 MMOLE/5ML IV SOLN
40.0000 meq | Freq: Once | INTRAVENOUS | Status: AC
  Administered 2017-09-23: 40 meq via INTRAVENOUS
  Filled 2017-09-23: qty 9.09

## 2017-09-23 MED ORDER — MAGNESIUM SULFATE 2 GM/50ML IV SOLN
2.0000 g | Freq: Once | INTRAVENOUS | Status: AC
  Administered 2017-09-24: 2 g via INTRAVENOUS
  Filled 2017-09-23: qty 50

## 2017-09-23 MED ORDER — PENTAFLUOROPROP-TETRAFLUOROETH EX AERO: 1.0000 "application " | INHALATION_SPRAY | CUTANEOUS | Status: DC | PRN

## 2017-09-23 MED ORDER — LIDOCAINE HCL (PF) 1 % IJ SOLN: 5.0000 mL | INTRAMUSCULAR | Status: DC | PRN

## 2017-09-23 NOTE — Evaluation (Signed)
Occupational Therapy Evaluation Patient Details Name: Kenneth Mckenzie MRN: [id number] DOB: 21-Feb-1956 Today's Date: 09/23/2017    History of Present Illness Pt is a 62 y/o male admitted secondary to HCAP, hypokalemia and ESRD. PMH including but not limited to HTN, HLD, DM, ESRD and prior CVAs.   Clinical Impression   Per chart review, pt was living with his wife and was dependent for ADLs and functional mobility PTA. Pt currently requiring total A for ADLs and bed mobility. Pt would benefit from further acute OT to facilitate safe dc. Depending of level of support at home and confirmation from family on pt PLOF then recommend dc home. Recommend dc with HHOT to optimize pt occupational performance and participation as well as decrease caregiver burden.     Follow Up Recommendations  Home health OT;Supervision/Assistance - 24 hour(Depending on if family can provide support)    Equipment Recommendations  Other (comment)(Pending information from family)    Recommendations for Other Services       Precautions / Restrictions Precautions Precautions: Fall Precaution Comments: contracted L UE from prior CVAs Restrictions Weight Bearing Restrictions: No      Mobility Bed Mobility Overal bed mobility: Needs Assistance Bed Mobility: Supine to Sit;Sit to Supine     Supine to sit: Total assist;HOB elevated Sit to supine: Total assist   General bed mobility comments: total A  Transfers                 General transfer comment: unable to perform this session secondary to pt's arousal level and unsafe with only one therapist    Balance Overall balance assessment: Needs assistance Sitting-balance support: No upper extremity supported;Feet supported Sitting balance-Leahy Scale: Zero Sitting balance - Comments: Total A to maintain sitting balance                                   ADL either performed or assessed with clinical judgement   ADL Overall ADL's :  Needs assistance/impaired                                       General ADL Comments: Total A for all ADLs and mobility. Fatigues quicky and only able to sustain sitting at EOB for ~3 min with total A     Vision         Perception     Praxis      Pertinent Vitals/Pain Pain Assessment: Faces Faces Pain Scale: Hurts even more Pain Location: generalized Pain Descriptors / Indicators: Grimacing Pain Intervention(s): Monitored during session;Limited activity within patient's tolerance;Repositioned     Hand Dominance Right   Extremity/Trunk Assessment Upper Extremity Assessment Upper Extremity Assessment: Generalized weakness;RUE deficits/detail;LUE deficits/detail RUE Deficits / Details: increased tone throughout with PROM restrictions at shoulder (limited to ~90 degrees of flexion) and at elbow (cannot achieve full extension); tightness in hands, wrist and fingers as well RUE Coordination: decreased fine motor;decreased gross motor LUE Deficits / Details: pt with flexion contractures at elbow and wrist from previous CVA LUE Coordination: decreased fine motor;decreased gross motor   Lower Extremity Assessment Lower Extremity Assessment: Defer to PT evaluation RLE Deficits / Details: pt with no active movement throughout assessment; PROM limitations at knee (cannot achieve full extension) and ankle (DF limited to neutral); pt with preference to maintain R LE in hip IR, hip  adduction and flexion, as well as knee flexion RLE Coordination: decreased fine motor;decreased gross motor LLE Deficits / Details: pt with no active movement throughout assessment; PROM limitations at knee (cannot achieve full extension) and ankle (DF limited to neutral); pt with preference to maintain R LE in hip adduction and flexion, as well as knee flexion LLE Coordination: decreased fine motor;decreased gross motor   Cervical / Trunk Assessment Cervical / Trunk Assessment: Other  exceptions Cervical / Trunk Exceptions: pt with preference for maintaining head in L cervical rotation in supine   Communication Communication Communication: Expressive difficulties(Non-verbal)   Cognition Arousal/Alertness: Lethargic Behavior During Therapy: Flat affect Overall Cognitive Status: History of cognitive impairments - at baseline                                     General Comments       Exercises     Shoulder Instructions      Home Living Family/patient expects to be discharged to:: Private residence Living Arrangements: Parent Available Help at Discharge: Family;Available 24 hours/day Type of Home: House Home Access: Ramped entrance     Home Layout: One level               Home Equipment: Wheelchair - manual;Hospital bed;Other (comment)(Hoyer Lift)   Additional Comments: Home set up gathered from chart review; pt able to nod yes or no to confirm information from chart review - pt providing consistant information.       Prior Functioning/Environment Level of Independence: Needs assistance  Gait / Transfers Assistance Needed: family uses lift for transfers; pt has w/c and can propel himself some with bilateral LEs per wife ADL's / Homemaking Assistance Needed: dependent            OT Problem List: Decreased strength;Decreased range of motion;Decreased activity tolerance;Impaired balance (sitting and/or standing);Decreased cognition;Decreased safety awareness;Decreased knowledge of use of DME or AE;Decreased coordination;Impaired UE functional use;Pain      OT Treatment/Interventions: Self-care/ADL training;Therapeutic exercise;Energy conservation;DME and/or AE instruction;Therapeutic activities;Patient/family education    OT Goals(Current goals can be found in the care plan section) Acute Rehab OT Goals Patient Stated Goal: unable to state OT Goal Formulation: Patient unable to participate in goal setting Time For Goal Achievement:  10/07/17 Potential to Achieve Goals: Good ADL Goals Pt Will Perform Eating: with mod assist;bed level;with adaptive utensils Pt Will Perform Grooming: with mod assist;with adaptive equipment;bed level Additional ADL Goal #1: Pt will perform bed mobility with Mod A+2 in preparation for ADLs in sitting Additional ADL Goal #2: Pt will maintain sitting posture for 5-8 minutes in performance of ADLs.  OT Frequency: Min 2X/week   Barriers to D/C:            Co-evaluation              AM-PAC PT "6 Clicks" Daily Activity     Outcome Measure Help from another person eating meals?: Total Help from another person taking care of personal grooming?: Total Help from another person toileting, which includes using toliet, bedpan, or urinal?: Total Help from another person bathing (including washing, rinsing, drying)?: Total Help from another person to put on and taking off regular upper body clothing?: Total Help from another person to put on and taking off regular lower body clothing?: Total 6 Click Score: 6   End of Session Nurse Communication: Mobility status;Other (comment)(BM in bed)  Activity Tolerance: Patient limited  by fatigue;Patient limited by lethargy Patient left: in bed;with call bell/phone within reach;with bed alarm set;with nursing/sitter in room  OT Visit Diagnosis: Other abnormalities of gait and mobility (R26.89);Unsteadiness on feet (R26.81);Muscle weakness (generalized) (M62.81);Pain;Hemiplegia and hemiparesis;Other symptoms and signs involving cognitive function Hemiplegia - Right/Left: Left Hemiplegia - dominant/non-dominant: Non-Dominant Pain - part of body: (Generalized)                Time: 1441-1456 OT Time Calculation (min): 15 min Charges:  OT General Charges $OT Visit: 1 Visit OT Evaluation $OT Eval Moderate Complexity: 1 Mod G-Codes:     Lyndie Vanderloop MSOT, OTR/L Acute Rehab Pager: [phone] Office: [phone]  Theodoro GristCharis M Crystal Scarberry 09/23/2017,  4:10 PM

## 2017-09-23 NOTE — Progress Notes (Signed)
SLP Cancellation Note  Patient Details Name: Kenneth Mckenzie MRN: [id number] DOB: 05-04-1956   Cancelled treatment:       Reason Eval/Treat Not Completed: Patient at procedure or test/unavailable. Pt getting cleaned up. Spoke with pt's wife on the phone. She confirms awareness that pt has chronic aspiration and that he has stated he does not want a feeding tube. Was on puree/pudding thick liquids prior to admit. Will continue efforts for evaluation.  Rondel BatonMary Beth Malanie Koloski, TennesseeMS, CCC-SLP Speech-Language Pathologist [phone]     Kenneth Mckenzie 09/23/2017, 3:04 PM

## 2017-09-23 NOTE — Progress Notes (Signed)
[facility] Progress Note   Subjective: Seen after NoseSwap.is UF 1L O2 sats dropping during HD. Up to 96% on 4L [place]. Mouthing responses  Objective Vitals:   09/23/17 1100 09/23/17 1130 09/23/17 1146 09/23/17 1213  BP: 100/65 119/69 129/75   Pulse: 93 98 100 (!) 106  Resp:   (!) (P) 22 (!) 26  Temp:   (P) 97.6 F (36.4 C)   TempSrc:   Axillary   SpO2:   96% 94%  Weight:      Height:       Physical Exam General: Frail ill appearing male NAD Heart: RRR  Lungs: Coarse breath sounds, rhonchi throughout  Abdomen: soft NT Extremities: muscle wasting No LE edema  Dialysis Access: L IJ TDC   Dialysis Orders:  TTS @ [facility] T4hr    [facility]J TC EDW 60.5kg (but left at 58.6kg last HD) - last HD 09/21/17 3K/2.25            Hecotorol IV TIW          Heparin 4000 unit bolus Mircera 50 q2weeks (last given 09/21/17)    Assessment/Plan: 1. Dyspnea/Aspiration PNA. IV antibiotics per primary. Recent prolonged hospital course at [facility] with PNA. Dysphasia with previous CVA  2. ESRD - TTS. HD today on schedule  3. Hypokalemia - Using 4K bath with HD, supplementing. Follow trend  3. Anemia - Hgb trending down, ESA recently dosed 1/3.  4. MBD- Phos low, KPhos dosed. Follow trend  5. HTN/volume -  BP stable. No volume overload on exam  6. Severe PCM - RD following. NPO at present. Swallow study pending  Dispo - Palliative care consult pending   Kenneth Mckenzie [facility] Pager [phone] 09/23/2017,12:38 PM  LOS: 1 day   Renal Attending; I agree with note above. Lauris Poag  Additional Objective Labs: Basic Metabolic Panel: Recent Labs  Lab 09/22/17 1208 09/22/17 2224 09/23/17 0440 09/23/17 0654  NA 138  --  137 137  K 2.5* 2.9* 3.1* 3.0*  CL 98*  --  98* 97*  CO2 27  --  25 25  GLUCOSE 152*  --  131* 121*  BUN 8  --  12 11  CREATININE 2.86*  --  3.32* 3.29*  CALCIUM 7.5*  --  7.8* 7.5*  PHOS 1.3* 2.4* 2.0* 2.0*   CBC: Recent Labs   Lab 09/22/17 0513 09/23/17 0440 09/23/17 0654  WBC 23.6* 18.3* 19.4*  NEUTROABS 20.6*  --   --   HGB 9.3* 8.5* 7.9*  HCT 30.4* 28.3* 26.0*  MCV 91.8 89.6 90.0  PLT 367 334 328   Blood Culture    Component Value Date/Time   SDES BLOOD LEFT HAND 09/22/2017 1208   SPECREQUEST IN PEDIATRIC BOTTLE Blood Culture adequate volume 09/22/2017 1208   CULT NO GROWTH < 24 HOURS 09/22/2017 1208   REPTSTATUS PENDING 09/22/2017 1208    Cardiac Enzymes: No results for input(s): CKTOTAL, CKMB, CKMBINDEX, TROPONINI in the last 168 hours. CBG: No results for input(s): GLUCAP in the last 168 hours. Iron Studies: No results for input(s): IRON, TIBC, TRANSFERRIN, FERRITIN in the last 72 hours. Lab Results  Component Value Date   INR 1.42 09/22/2017   INR 1.1 05/15/2016   Medications: . sodium chloride    . sodium chloride    . sodium chloride    . piperacillin-tazobactam (ZOSYN)  IV Stopped (09/23/17 0300)   . apixaban  2.5 mg Oral BID  . heparin  4,000 Units Dialysis Once in dialysis  . mouth rinse  15 mL Mouth Rinse BID  . sodium chloride flush  3 mL Intravenous Q12H  . sodium chloride flush  3 mL Intravenous Q12H  . timolol  1 drop Both Eyes BID

## 2017-09-23 NOTE — Evaluation (Signed)
Clinical/Bedside Swallow Evaluation Patient Details  Name: Kenneth Mckenzie MRN: [id number] Date of Birth: 09-21-1955  Today's Date: 09/23/2017 Time: SLP Start Time (ACUTE ONLY): 1710 SLP Stop Time (ACUTE ONLY): 1729 SLP Time Calculation (min) (ACUTE ONLY): 19 min  Past Medical History:  Past Medical History:  Diagnosis Date  . Borderline diabetic   . Coronary heart disease   . Hypertension   . Renal disease   . Stroke [facility])    Past Surgical History:  Past Surgical History:  Procedure Laterality Date  . Open Heart Surgery     HPI:  Pt is a 62 y/o male admitted secondary to HCAP, hypokalemia and ESRD. PMH including but not limited to HTN, HLD, DM, ESRD and prior CVAs. Per care everywhere, pt frequently admitted for aspiration PNA at [facility]; family decided against feeding tube placement and accepting of aspiration risks. Per MBS 08/31/17 at [facility] was on puree/pudding thick liquids. Referred for swallowing evaluation.   Assessment / Plan / Recommendation Clinical Impression  Patient presents with chronic severe oropharyngeal dysphagia. Bedside swallow evaluation was limited today as pt accepting minimal intake, however no overt signs of aspiration observed with several bites of pureed solids. Oral preparation slow; noted anterior secretion loss between bites. Reviewed report from pt's MBS 08/31/17 at [facility] which revealed no penetration or aspiration of purees. Spoke with pt re: his wishes and he confirmed (as did his wife over the phone) that he would not want a feeding tube. Educated pt re: risks of aspiration which he confirms he is aware of. Recommend initiating dys 1/pudding thick liquids; oral care before and after PO recommended to minimize oral bacterial load due to probable aspiration of secretions. Will follow for tolerance, education, may consider MBS for possible advancement of liquids or to assist in making comfort recommendations if necessary.  SLP Visit Diagnosis: Dysphagia,  oropharyngeal phase (R13.12)    Aspiration Risk  Moderate aspiration risk;Severe aspiration risk    Diet Recommendation Dysphagia 1 (Puree);Pudding-thick liquid   Liquid Administration via: Spoon Medication Administration: Crushed with puree Supervision: Staff to assist with self feeding;Full supervision/cueing for compensatory strategies Compensations: Slow rate;Small sips/bites;Minimize environmental distractions Postural Changes: Seated upright at 90 degrees    Other  Recommendations Oral Care Recommendations: Oral care before and after PO Other Recommendations: Order thickener from pharmacy;Remove water pitcher;Prohibited food (jello, ice cream, thin soups);Have oral suction available;Clarify dietary restrictions   Follow up Recommendations Other (comment)(tba)      Frequency and Duration min 2x/week  1 week       Prognosis Prognosis for Safe Diet Advancement: Guarded Barriers to Reach Goals: Severity of deficits;Time post onset      Swallow Study   General Date of Onset: 09/23/17 HPI: Pt is a 62 y/o male admitted secondary to HCAP, hypokalemia and ESRD. PMH including but not limited to HTN, HLD, DM, ESRD and prior CVAs. Per care everywhere, pt frequently admitted for aspiration PNA at [facility]; family decided against feeding tube placement and accepting of aspiration risks. Per MBS 08/31/17 at [facility] was on puree/pudding thick liquids. Referred for swallowing evaluation. Type of Study: Bedside Swallow Evaluation Previous Swallow Assessment: see HPI Diet Prior to this Study: NPO Temperature Spikes Noted: Yes(99.5) Respiratory Status: Nasal cannula History of Recent Intubation: No Behavior/Cognition: Alert;Cooperative Oral Cavity Assessment: Dry;Dried secretions Oral Care Completed by SLP: Yes Oral Cavity - Dentition: Poor condition;Missing dentition Vision: Functional for self-feeding Self-Feeding Abilities: Needs assist Patient Positioning: Upright in bed Baseline Vocal  Quality: Low vocal intensity Volitional  Cough: Weak Volitional Swallow: Able to elicit    Oral/Motor/Sensory Function Overall Oral Motor/Sensory Function: Generalized oral weakness   Ice Chips Ice chips: Not tested   Thin Liquid Thin Liquid: Not tested    Nectar Thick Nectar Thick Liquid: Not tested   Honey Thick Honey Thick Liquid: Not tested   Puree Puree: Impaired Oral Phase Impairments: Reduced labial seal;Reduced lingual movement/coordination Oral Phase Functional Implications: Prolonged oral transit   Solid   GO   Solid: Not tested        Arlana LindauMary E Mutasim Tuckey 09/23/2017,5:50 PM  Rondel BatonMary Beth Tashianna Broome, MS, CCC-SLP Speech-Language Pathologist [phone]

## 2017-09-23 NOTE — Progress Notes (Signed)
Clarified with MD on call,okay to give p.o medication with caution. Kenneth Mckenzie, Kenneth Mckenzie, Kenneth Mckenzie

## 2017-09-23 NOTE — Procedures (Signed)
Tol HD.  Looks on dry side, hypoalbuminemic and hypokalemic Nutrition is major issue as well as CNS old insults. Lauris PoagAlvin C Abubakar Crispo

## 2017-09-23 NOTE — Progress Notes (Signed)
Patients heart rate tachy at 115, patient had a 7 beat run of V-tach. Patient now on 3L O2 sats at 97. MD paged.  Avelina LaineKimberly Andrick Rust RN

## 2017-09-23 NOTE — Progress Notes (Signed)
RT called by HD nurse in regards to pt desating and "not looking the same"   Pt finished his HD.  Pt's breath sounds are rhonchus and pt has chronic asp issues.  No distress noted, but RR are in mid 20s and sats 93-94% on 3L [place].  RN states pt was 100% sats on RA until about 30 min. Ago.   RT will continue to monitor.  Pt is going back to his previous room.

## 2017-09-23 NOTE — Progress Notes (Signed)
Initial Nutrition Assessment  DOCUMENTATION CODES:  Severe malnutrition in context of chronic illness, Underweight  INTERVENTION:  RD will order aggressive supplementation (remains full code) w/ appropriate nutritional supplements after evaluated by ST  Palliative Care has been consulted to help w/ GOC, which RD would have recommended as well, given severity of malnutrition w/ wounds and inability to maintain nutrition w/ oral intake and refusal of nutrition via alternative means.   If tube feeding is pursued, he would certainly be very high risk for refeeding syndrome, already with low K/Phos  NUTRITION DIAGNOSIS:  Severe Malnutrition related to recurrent acute illnesses r/t chronic dysphagia as result of CVA as evidenced by severe muscle depletion, severe fat depletion.  GOAL:  Patient will meet greater than or equal to 90% of their needs  MONITOR:  Labs, Weight trends, Diet advancement, I & O's, Supplement acceptance  REASON FOR ASSESSMENT:  Consult Malnutrition Eval  ASSESSMENT:  62 y/o male Pmhx HTN, ESRD on HD, Multiple CVAs w/ resultant dysarthria and dysphagia w/ recurrent/frequent aspiration events. Pt has had several recent hospitalizations for sepsis r/t PNA. Brought to ED by wife due to concerns of abnormal breathing. Worked up for elevated WBC and persistent but improved bibasilar infiltrates. Admitted for management of suspected PNA.  Saw patient while receiving dialysis. He is largely nonverbal. The only thing he said was "(something) my legs" , which he repeated a few times, but was unable to discern what the first word was.   He shook his head no when asked about any appetite. He also denies any n/v/c/d. He nodded yes when RD said he would order appropriate supplements when diet is advanced.   Regarding weight, per Care Everywhere, he was ~141 lbs [facility] and 130.6 lbs [facility] Bed weight during HD today was 57.9  kg, indicating further wt loss.   Family apparently has refused feeding tube for nutrition. As such, pt is relegated to attempt to maintain his nutrition orally, which has repeatedly been shown to be unsafe and ineffective.   Unsure of efficacy of PEG would even offer as unclear how much of his aspiration is related to oral intake as opposed to his own secretions.  ST has been consulted to evaluate. RD will order aggressive supplementation (remains full code) w/ appropriate nutritional supplements.   Though pt full code, family refused feeding tube, relegating patient to attempt to maintain nutritionally orally, which has been seen to be ineffective/unsafe, evidenced by continued wt loss/repeated hospitalizations.   If tube feeding is pursued, he would certainly be very high risk for refeeding syndrome, already with low K/Phos  Palliative Care has been consulted to help w/ GOC, which RD would have recommended as well, given severity of malnutrition w/ wounds and inability to maintain nutrition w/ oral intake.   Physical Exam: Profoundly cachectic. Severe lower body muscle wasting, contracted. Severe upper body muscle/fat wasting w/ LUE contracted.   Labs: Albumin: 1.5, h/h:7.9/26, Phos 2.0, K.3.0, WBC:19.4, BG: 120-152 Meds: IV abx  Recent Labs  Lab 09/22/17 0709  09/22/17 1208 09/22/17 2224 09/23/17 0440 09/23/17 0654  NA  --   --  138  --  137 137  K  --   --  2.5* 2.9* 3.1* 3.0*  CL  --   --  98*  --  98* 97*  CO2  --   --  27  --  25 25  BUN  --   --  8  --  12 11  CREATININE  --   --  2.86*  --  3.32* 3.29*  CALCIUM  --   --  7.5*  --  7.8* 7.5*  MG 1.7  --   --   --   --   --   PHOS  --    < > 1.3* 2.4* 2.0* 2.0*  GLUCOSE  --   --  152*  --  131* 121*   < > = values in this interval not displayed.   NUTRITION - FOCUSED PHYSICAL EXAM:   Most Recent Value  Orbital Region  Moderate depletion  Upper Arm Region  Severe depletion  Thoracic and Lumbar Region  Severe depletion   Buccal Region  Moderate depletion  Temple Region  Moderate depletion  Clavicle Bone Region  Severe depletion  Clavicle and Acromion Bone Region  Severe depletion  Scapular Bone Region  Severe depletion  Dorsal Hand  Severe depletion  Patellar Region  Severe depletion  Anterior Thigh Region  Severe depletion  Posterior Calf Region  Severe depletion  Edema (RD Assessment)  None      Diet Order:  Diet NPO time specified  EDUCATION NEEDS:  No education needs have been identified at this time  Skin: MSAD to buttocks (Per WOC note, has patchy area of partial thickness loss to bilat buttocks) DTI L-upper buttocks.  Last BM:  1/5  Height:  Ht Readings from Last 1 Encounters:  09/22/17 6\' 3"  (1.905 m)   Weight:  Wt Readings from Last 1 Encounters:  09/23/17 127 lb 10.3 oz (57.9 kg)   Wt Readings from Last 10 Encounters:  09/23/17 127 lb 10.3 oz (57.9 kg)  06/07/16 173 lb (78.5 kg)  05/24/16 174 lb (78.9 kg)   Ideal Body Weight:  80.19 kg(Adjusted for bedbound state)  BMI:  Body mass index is 15.95 kg/m.  Estimated Nutritional Needs:  Kcal:  2000-2200 kcals (35-38 kcal/kg bw) Protein:  87-105g Pro (1.5-1.8 g/kg bw) Fluid:  Per md  Christophe Louis RD, LDN, CNSC Clinical Nutrition Pager: [phone] 09/23/2017 12:27 PM

## 2017-09-23 NOTE — Progress Notes (Signed)
Subjective:  Patient seen laying in bed during dialysis in no acute distress. Patient non verbal but nods head yes and no to different questions. No acute complaints this AM.   Objective:  Vital signs in last 24 hours: Vitals:   09/23/17 0746 09/23/17 0800 09/23/17 0830 09/23/17 0900  BP: 117/68 98/62 106/71 109/67  Pulse: 77 79 80 80  Resp:      Temp:      TempSrc:      SpO2:      Weight:      Height:       Physical Exam  Constitutional:  Severely cachetic appearing man laying in bed in no acute distress. Does not spontaneously move on his own.   HENT:  Mouth/Throat: Oropharynx is clear and moist.  Eyes: Conjunctivae are normal. Right eye exhibits no discharge. Left eye exhibits no discharge. No scleral icterus.  Cardiovascular: Normal rate, regular rhythm and intact distal pulses. Exam reveals no friction rub.  No murmur heard. Respiratory:  Poor effort. No crackles or wheezing appreciated. Interval improvement from yesterday's exam.   GI: Soft. He exhibits no distension. There is no tenderness. There is no rebound.  Musculoskeletal: He exhibits deformity (Patient's bilateral upper and lower extremities severely contracted and difficult to move during exam). He exhibits no edema (of bilateral lower extremities) or tenderness (of bilateral lower extremities).  Skin: Skin is warm and dry. No rash noted. No erythema.   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 Patient recently admitted and discharged from OSH on 09/19/2017 after 7 days IV zosyn for treatment of  aspiration pneumonia. Patient admitted to internal medicine teaching service for management. The specific problems addressed during admission were as follows:  Aspiration PNA 2/2 Dysphagia from multiple previous CVAs: Patient presented afebrile and hemodynamically stable. Patient had leukocytosis, which is resolving with IV antibtioics for treatment of aspiration pneumonia. His chest x ray on admission showed interval improvement compared to that observed during hospitalization from 12/25 -1/1 for treatment of aspiration pneumonia. Patient's PE improved from yesterday on antibiotics. Will continue current therapy. Will plan for 5 days antibiotics. -SLP consulted and appreciated -NPO until above evaluation with NS @ rate of 50 ml/hr -Continue zosyn, day 2/5   Hypokalemia and Hypophosphatemia: Patient received IV phosphorus, magnesium, and potassium replacement yesterday. Will continue aggressive electrolyte replacement since patient severely malnourished on exam and multiple abnormalities noted on admission labs. -Follow up BMP, Mg, Phos @ 7 pm -Daily BMP, Phos while replacing electrolytes  Severe protein-calorie malnutrition: Per chart review family has dismissed idea of tube feeds in patient. Will not to pursue this avenue right now given above electrolytes abnormalities and high risk of refeeding syndrome. Patient will need palliative care consult to discuss goals of care while inpatient, as meeting his nutritional needs for wound healing will be difficult.  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 Patient subtherapeutic when represented to hospital for evaluation on 12/25, so transitioned to  Eliquis.  -Continue home Eliquis 2.5 mg BID  Stage II Sacral Pressure Ulcers: Present on admission. No signs or symptoms of infection.  -Wound care Wound care consulted. Appreciate assistance.  FEN/GI: -NPO pending SLP evaluation -NS @ rate of 50 ml/hr, replacing electrolytes as needed with PRN potassium phosphorus  VTE Prophylaxis: Eliquis 2.5 mg daily Code Status: Full, verified on admission  Dispo: Anticipated discharge pending clinical improvement.   Rozann Lesches, MD 09/23/2017, 10:28 AM Pager: [phone]

## 2017-09-23 NOTE — Progress Notes (Signed)
SLP Cancellation Note  Patient Details Name: Kenneth EdelsonMichael A Mckenzie MRN: [id number] DOB: 06/30/1956   Cancelled treatment:        Pt in HD. Will continue efforts.  Rondel BatonMary Beth Cashlynn Yearwood, [place], CCC-SLP Speech-Language Pathologist [phone]                                                                                                Kenneth LindauMary E Arye Mckenzie 09/23/2017, 12:08 PM

## 2017-09-24 DIAGNOSIS — R1312 Dysphagia, oropharyngeal phase: Secondary | ICD-10-CM | POA: Diagnosis present

## 2017-09-24 DIAGNOSIS — Z515 Encounter for palliative care: Secondary | ICD-10-CM

## 2017-09-24 DIAGNOSIS — Z7189 Other specified counseling: Secondary | ICD-10-CM

## 2017-09-24 DIAGNOSIS — J690 Pneumonitis due to inhalation of food and vomit: Principal | ICD-10-CM

## 2017-09-24 DIAGNOSIS — E43 Unspecified severe protein-calorie malnutrition: Secondary | ICD-10-CM

## 2017-09-24 DIAGNOSIS — R0600 Dyspnea, unspecified: Secondary | ICD-10-CM

## 2017-09-24 DIAGNOSIS — N186 End stage renal disease: Secondary | ICD-10-CM

## 2017-09-24 LAB — PHOSPHORUS: Phosphorus: 2.8 mg/dL (ref 2.5–4.6)

## 2017-09-24 LAB — BASIC METABOLIC PANEL
ANION GAP: 12 (ref 5–15)
BUN: 6 mg/dL (ref 6–20)
CHLORIDE: 96 mmol/L — AB (ref 101–111)
CO2: 26 mmol/L (ref 22–32)
Calcium: 7.5 mg/dL — ABNORMAL LOW (ref 8.9–10.3)
Creatinine, Ser: 2.25 mg/dL — ABNORMAL HIGH (ref 0.61–1.24)
GFR calc Af Amer: 34 mL/min — ABNORMAL LOW (ref >60)
GFR, EST NON AFRICAN AMERICAN: 30 mL/min — AB (ref >60)
GLUCOSE: 154 mg/dL — AB (ref 65–99)
POTASSIUM: 3.2 mmol/L — AB (ref 3.5–5.1)
Sodium: 134 mmol/L — ABNORMAL LOW (ref 135–145)

## 2017-09-24 MED ORDER — MAGNESIUM SULFATE 2 GM/50ML IV SOLN: 2.0000 g | Freq: Once | INTRAVENOUS | Status: DC

## 2017-09-24 MED ORDER — DOXERCALCIFEROL 4 MCG/2ML IV SOLN
1.0000 ug | INTRAVENOUS | Status: DC
  Administered 2017-09-26: 1 ug via INTRAVENOUS
  Filled 2017-09-24 (×2): qty 2

## 2017-09-24 MED ORDER — RESOURCE THICKENUP CLEAR PO POWD
ORAL | Status: DC | PRN
  Filled 2017-09-24: qty 125

## 2017-09-24 MED ORDER — POTASSIUM CHLORIDE 10 MEQ/100ML IV SOLN
10.0000 meq | INTRAVENOUS | Status: AC
  Administered 2017-09-24 (×3): 10 meq via INTRAVENOUS
  Filled 2017-09-24 (×3): qty 100

## 2017-09-24 NOTE — Progress Notes (Signed)
[facility] Progress Note   Subjective:  O2 sats dropping during HD yesterday. Stable on 3L [place] this morning.   Objective Vitals:   09/23/17 1257 09/23/17 1650 09/23/17 2136 09/24/17 0528  BP: (!) 114/58 129/74 118/62 (!) 100/56  Pulse: (!) 112 (!) 104 (!) 104 89  Resp: 20 18 16 14   Temp: 97.7 F (36.5 C) 99.5 F (37.5 C) 99.7 F (37.6 C) 97.7 F (36.5 C)  TempSrc: Oral Oral    SpO2: 97% 100% 100% 100%  Weight:   56 kg (123 lb 7.3 oz)   Height:       Physical Exam General: Frail ill appearing male NAD Heart: RRR  Lungs: Coarse breath sounds, rhonchi throughout  Abdomen: soft NT Extremities: upper extremity contractures; muscle wasting No LE edema  Dialysis Access: L IJ TDC   Dialysis Orders:  TTS @ [facility] T4hr    LIJ TC EDW 60.5kg (but left at 58.6kg last HD) - last HD 09/21/17 3K/2.25            Hecotorol IV TIW          Heparin 4000 unit bolus Mircera 50 q2weeks (last given 09/21/17)    Assessment/Plan: 1. Dyspnea/Aspiration PNA. IV antibiotics per primary. Recent prolonged hospital course at [facility] with PNA.  2. Hx Dysphasia, dysarthria  with previous CVAs 2. ESRD - TTS. Next HD 1/8  3. Hypokalemia - Using 4K bath with HD, supplementing. Follow trend  4. Anemia - Hgb trending down, ESA recently dosed 1/3.  5. MBD- Phos low, Improving with supplement, Continue Hectorol  6. HTN/volume -  BP stable. No volume overload on exam. 4kg below EDW by weights here  7. Severe PCM - Nutritional supplements per RD. DYS-1 diet  8. Sacral pressure ulcers - WOC following  Dispo - Palliative care consult pending   Tomasa Blase [facility] Pager [phone] 09/24/2017,9:24 AM  LOS: 2 days    Additional Objective Labs: Basic Metabolic Panel: Recent Labs  Lab 09/23/17 0440 09/23/17 0654 09/23/17 1906 09/24/17 0411  NA 137 137  --  134*  K 3.1* 3.0* 4.1 3.2*  CL 98* 97*  --  96*  CO2 25 25  --  26  GLUCOSE 131* 121*  --  154*   BUN 12 11  --  6  CREATININE 3.32* 3.29*  --  2.25*  CALCIUM 7.8* 7.5*  --  7.5*  PHOS 2.0* 2.0* 2.3* 2.8   CBC: Recent Labs  Lab 09/22/17 0513 09/23/17 0440 09/23/17 0654  WBC 23.6* 18.3* 19.4*  NEUTROABS 20.6*  --   --   HGB 9.3* 8.5* 7.9*  HCT 30.4* 28.3* 26.0*  MCV 91.8 89.6 90.0  PLT 367 334 328   Blood Culture    Component Value Date/Time   SDES BLOOD LEFT HAND 09/22/2017 1208   SPECREQUEST IN PEDIATRIC BOTTLE Blood Culture adequate volume 09/22/2017 1208   CULT NO GROWTH < 24 HOURS 09/22/2017 1208   REPTSTATUS PENDING 09/22/2017 1208    Cardiac Enzymes: No results for input(s): CKTOTAL, CKMB, CKMBINDEX, TROPONINI in the last 168 hours. CBG: No results for input(s): GLUCAP in the last 168 hours. Iron Studies: No results for input(s): IRON, TIBC, TRANSFERRIN, FERRITIN in the last 72 hours. Lab Results  Component Value Date   INR 1.42 09/22/2017   INR 1.1 05/15/2016   Medications: . sodium chloride    . sodium chloride 50 mL/hr at 09/24/17 0019  . piperacillin-tazobactam (ZOSYN)  IV  3.375 g (09/24/17 0855)   . apixaban  2.5 mg Oral BID  . mouth rinse  15 mL Mouth Rinse BID  . sodium chloride flush  3 mL Intravenous Q12H  . sodium chloride flush  3 mL Intravenous Q12H  . timolol  1 drop Both Eyes BID     Renal Attending: As above.  We need to be careful and not dehydrate him with HD Lauris PoagAlvin C Novalee Horsfall

## 2017-09-24 NOTE — Consult Note (Signed)
Consultation Note Date: 09/24/2017   Patient Name: Kenneth Mckenzie  DOB: 09/20/1955  MRN: [id number]  Age / Sex: 62 y.o., male  PCP: Patient, No Pcp Per Referring Physician: Lucious Groves, DO  Reason for Consultation: Establishing goals of care  HPI/Patient Profile: 62 y.o. male  with past medical history of ESRD on dialysis, DM, bipolar d/o, CVA's w/ resulting dysphagia and dysarthria, ischemic limb, hx recent admissions to different facility for likely aspiration pneumonia, admitted on 09/22/2017 with SOB. Workup reveals likely recurrent aspiration pneumonia, malnutrition. Palliative medicine consulted for [place].   Clinical Assessment and Goals of Care: Evaluated patient. He is in bed. Very lethargic. Barely opens eyes to my voice. Nods head "No" when I ask if he's in pain. Cannot participate in [place] conversation.  Chart reviewed- noted severe malnutrition with Albumin1.7, multiple wounds, contractures. Family declining alternate feeding methods, patient unable to support nutritional needs by PO intake and continues to aspirate.  Called patient's spouse to arrange [place] meeting- she tells me she has met with Palliative Medicine team at other hospitals "and I've probably heard everything you have to say". She declined to meet today. She stated she will discuss with her children and call back and possibly meet with me tomorrow.   Per chart review- palliative discussions had during previous admissions at alternate facility. Patient's spouse is Media planner and has always requested Full code, and full scope care, has declined feeding tube in the past.     Primary Decision Maker NEXT OF KIN - patient's spouse    SUMMARY OF RECOMMENDATIONS - Continue full scope care -Gave patient's spouse my contact information and requested she call back so we can arrange time for [place] meeting    Code Status/Advance Care  Planning:  DNR  Palliative Prophylaxis:   Aspiration  Additional Recommendations (Limitations, Scope, Preferences):  Full Scope Treatment and No Artificial Feeding  Prognosis:    < 3 months likely in setting of recurrent aspiration pnuemonia, poor po intake with no planned alternative feeding  Discharge Planning: To Be Determined  Primary Diagnoses: Present on Admission: . Aspiration pneumonia ([place]) . Pressure injury of sacral region, stage 2 . DM type 2 causing ESRD ([place]) . Hypokalemia . Severe protein-calorie malnutrition ([place])   I have reviewed the medical record, interviewed the patient and family, and examined the patient. The following aspects are pertinent.  Past Medical History:  Diagnosis Date  . Borderline diabetic   . Coronary heart disease   . Hypertension   . Renal disease   . Stroke [facility])    Social History   Socioeconomic History  . Marital status: Married    Spouse name: None  . Number of children: None  . Years of education: None  . Highest education level: None  Social Needs  . Financial resource strain: None  . Food insecurity - worry: None  . Food insecurity - inability: None  . Transportation needs - medical: None  . Transportation needs - non-medical: None  Occupational History  . None  Tobacco Use  . Smoking status: Former Research scientist (life sciences)  . Smokeless tobacco: Never Used  Substance and Sexual Activity  . Alcohol use: No  . Drug use: No  . Sexual activity: None  Other Topics Concern  . None  Social History Narrative  . None   Family History  Problem Relation Age of Onset  . Diabetes Mother   . Hypertension Mother   . Diabetes Father   . Hypertension Father    Scheduled Meds: . apixaban  2.5 mg Oral BID  . [START ON 09/26/2017] doxercalciferol  1 mcg Intravenous Q T,Th,Sa-HD  . mouth rinse  15 mL Mouth Rinse BID  . sodium chloride flush  3 mL Intravenous Q12H  . sodium chloride flush  3 mL Intravenous Q12H  . timolol  1 drop Both  Eyes BID   Continuous Infusions: . sodium chloride    . sodium chloride 50 mL/hr at 09/24/17 0019  . piperacillin-tazobactam (ZOSYN)  IV 3.375 g (09/24/17 0855)   PRN Meds:.sodium chloride, RESOURCE THICKENUP CLEAR, sodium chloride flush Medications Prior to Admission:  Prior to Admission medications   Medication Sig Start Date End Date Taking? Authorizing Provider  apixaban (ELIQUIS) 2.5 MG TABS tablet Take 2.5 mg by mouth 2 (two) times daily.   Yes [provider]  metoprolol tartrate (LOPRESSOR) 25 MG tablet Take 12.5 mg by mouth 2 (two) times daily.   Yes [provider]  timolol (BETIMOL) 0.5 % ophthalmic solution Place 1 drop into both eyes 2 (two) times daily.   Yes [provider]  lisinopril (PRINIVIL,ZESTRIL) 40 MG tablet Take 40 mg by mouth daily.    [provider]   Allergies  Allergen Reactions  . Zofran [Ondansetron Hcl] Nausea And Vomiting   Review of Systems  Unable to perform ROS: Mental status change    Physical Exam  Constitutional:  Severe cachexia  Pulmonary/Chest:  diminished  Abdominal: Soft. Bowel sounds are normal.  Musculoskeletal:  +contractures, +muscle wasting  Neurological:  Lethargic, nonverbal, UTA orientation  Nursing note and vitals reviewed.   Vital Signs: BP (!) 104/52 (BP Location: Left Arm)   Pulse 85   Temp 97.8 F (36.6 C) (Oral)   Resp 16   Ht 6' 3"  (1.905 m)   Wt 56 kg (123 lb 7.3 oz)   SpO2 93%   BMI 15.43 kg/m  Pain Assessment: No/denies pain       SpO2: SpO2: 93 % O2 Device:SpO2: 93 % O2 Flow Rate: .O2 Flow Rate (L/min): 3 L/min  IO: Intake/output summary:   Intake/Output Summary (Last 24 hours) at 09/24/2017 1014 Last data filed at 09/24/2017 0601 Gross per 24 hour  Intake 520 ml  Output 1000 ml  Net -480 ml    LBM: Last BM Date: 09/23/17 Baseline Weight: Weight: 59 kg (130 lb 1.1 oz) Most recent weight: Weight: 56 kg (123 lb 7.3 oz)     Palliative Assessment/Data: PPS:  20%     Thank you for this consult. Palliative medicine will continue to follow and assist as needed.   Time In: 0930 Time Out: 1030 Time Total: 60 minutes Greater than 50%  of this time was spent counseling and coordinating care related to the above assessment and plan.  Signed by: Mariana Kaufman, AGNP-C Palliative Medicine    Please contact Palliative Medicine Team phone at [phone] for questions and concerns.  For individual provider: See Shea [facility]

## 2017-09-24 NOTE — Progress Notes (Signed)
   Subjective: Nods his head that he is feeling well today, free of complaints, no distress, no pain. Nods his head "no" when asked if he has been coughing. No family is present in the room.   Objective:  Vital signs in last 24 hours: Vitals:   09/23/17 1650 09/23/17 2136 09/24/17 0528 09/24/17 0923  BP: 129/74 118/62 (!) 100/56 (!) 104/52  Pulse: (!) 104 (!) 104 89 85  Resp: 18 16 14 16   Temp: 99.5 F (37.5 C) 99.7 F (37.6 C) 97.7 F (36.5 C) 97.8 F (36.6 C)  TempSrc: Oral   Oral  SpO2: 100% 100% 100% 93%  Weight:  123 lb 7.3 oz (56 kg)    Height:       General: lying in bed with eyes closed, no acute distress, opens eyes to voice and nods yes and no to questions  Cardiac: RRR, no murmurs appreciated, no peripheral edema  Lungs: clear to auscultation over anterior lung fields, not coughing   MSK: legs and arms are contracted, significant muscle wasting appreciated   Assessment/Plan:  Kenneth Mckenzie 62 yo with PMH of dysphagia and recurrent aspiration 2/2 multiple prior CVAs, ESRD on HD T/Th/Sat, who presented with complaint of shortness of breath. Patient recently admitted and discharged from OSH on 09/19/2017 after 7 days IV zosyn for treatment of aspiration pneumonia. Patient admitted to internal medicine teaching service for management. The specific problems addressed during admission were as follows:  ? Aspiration pneumonia, dyspnea  - Recently treated with 7 day course of zosyn  - Day 2 of zosyn, short abx course may be favored if blood cultures are neg 2 days  - 02 sat dropped in HD yesterday, improved today so attemptingwean to room air  - palliative consulted, wife not interested in speaking with them today but may meet tomorrow  - SLP evaluated, dys 1 diet ordered     Hypokalemia - K 3.2, replacing -nephrology using K bath at HD - monitor     Hypophosphatemia - phos 2.8 up from 2.3 yesterday  - monitor    Hypomagnesemia  - Mag 1.8 yesterday, replaced, will  monitor     ESRD on dialysis (HCC) on TTS HD  - low-normotensive, no volume overload, potassium stable  - nephrology following, appreciate their care, planning for HD 1/8 if he is still hospitalized     Severe protein-calorie malnutrition (HCC)   Dysphagia due to old stroke - SLP following, appreciate their recommendations and ordering dysphagia 1 diet     Pressure injury of sacral region, stage 2  - wound care following, appreciate their recommendations   Dispo: Anticipated discharge in approximately 1-2 day(s), pending blood cultures negative 72 hours   Kenneth Mckenzie, Kenneth Olexa, MD 09/24/2017, 2:25 PM Pager: [phone]

## 2017-09-25 DIAGNOSIS — I69321 Dysphasia following cerebral infarction: Secondary | ICD-10-CM

## 2017-09-25 DIAGNOSIS — Z86718 Personal history of other venous thrombosis and embolism: Secondary | ICD-10-CM

## 2017-09-25 DIAGNOSIS — M6259 Muscle wasting and atrophy, not elsewhere classified, multiple sites: Secondary | ICD-10-CM

## 2017-09-25 DIAGNOSIS — L89152 Pressure ulcer of sacral region, stage 2: Secondary | ICD-10-CM

## 2017-09-25 DIAGNOSIS — Z992 Dependence on renal dialysis: Secondary | ICD-10-CM

## 2017-09-25 DIAGNOSIS — R1312 Dysphagia, oropharyngeal phase: Secondary | ICD-10-CM

## 2017-09-25 DIAGNOSIS — J189 Pneumonia, unspecified organism: Secondary | ICD-10-CM

## 2017-09-25 LAB — CBC WITH DIFFERENTIAL/PLATELET
Basophils Absolute: 0 10*3/uL (ref 0.0–0.1)
Basophils Relative: 0 %
EOS ABS: 0.2 10*3/uL (ref 0.0–0.7)
EOS PCT: 2 %
HCT: 26.2 % — ABNORMAL LOW (ref 39.0–52.0)
HEMOGLOBIN: 8 g/dL — AB (ref 13.0–17.0)
LYMPHS ABS: 0.7 10*3/uL (ref 0.7–4.0)
Lymphocytes Relative: 5 %
MCH: 27.4 pg (ref 26.0–34.0)
MCHC: 30.5 g/dL (ref 30.0–36.0)
MCV: 89.7 fL (ref 78.0–100.0)
MONO ABS: 1.3 10*3/uL — AB (ref 0.1–1.0)
MONOS PCT: 9 %
NEUTROS PCT: 84 %
Neutro Abs: 12.5 10*3/uL — ABNORMAL HIGH (ref 1.7–7.7)
Platelets: 324 10*3/uL (ref 150–400)
RBC: 2.92 MIL/uL — ABNORMAL LOW (ref 4.22–5.81)
RDW: 16.2 % — AB (ref 11.5–15.5)
WBC: 14.8 10*3/uL — ABNORMAL HIGH (ref 4.0–10.5)

## 2017-09-25 LAB — BASIC METABOLIC PANEL
ANION GAP: 13 (ref 5–15)
BUN: 13 mg/dL (ref 6–20)
CHLORIDE: 101 mmol/L (ref 101–111)
CO2: 24 mmol/L (ref 22–32)
Calcium: 7.9 mg/dL — ABNORMAL LOW (ref 8.9–10.3)
Creatinine, Ser: 3.45 mg/dL — ABNORMAL HIGH (ref 0.61–1.24)
GFR calc non Af Amer: 18 mL/min — ABNORMAL LOW (ref >60)
GFR, EST AFRICAN AMERICAN: 20 mL/min — AB (ref >60)
Glucose, Bld: 94 mg/dL (ref 65–99)
POTASSIUM: 3.6 mmol/L (ref 3.5–5.1)
Sodium: 138 mmol/L (ref 135–145)

## 2017-09-25 LAB — PHOSPHORUS: Phosphorus: 3.3 mg/dL (ref 2.5–4.6)

## 2017-09-25 NOTE — Progress Notes (Signed)
[facility] Progress Note   Subjective:  Seen in room. Resting comfortably, but shakes head no in regards to dyspnea or pain.   Objective Vitals:   09/24/17 0923 09/24/17 1700 09/24/17 2125 09/25/17 0501  BP: (!) 104/52 (!) 112/58 123/70 116/65  Pulse: 85 91 93 74  Resp: 16 16 12 16   Temp: 97.8 F (36.6 C) 98.2 F (36.8 C) 99 F (37.2 C) 98.4 F (36.9 C)  TempSrc: Oral Oral    SpO2: 93% 96% 100% 100%  Weight:   56 kg (123 lb 7.3 oz)   Height:       Physical Exam General: Frail man, NAD. On nasal oxygen. Contractures in UE. Heart: RRR; no murmur Lungs: CTA anteriorly Extremities: No LE edema Dialysis Access: TDC in L chest; no erythema  Additional Objective Labs: Basic Metabolic Panel: Recent Labs  Lab 09/23/17 0440 09/23/17 0654 09/23/17 1906 09/24/17 0411  NA 137 137  --  134*  K 3.1* 3.0* 4.1 3.2*  CL 98* 97*  --  96*  CO2 25 25  --  26  GLUCOSE 131* 121*  --  154*  BUN 12 11  --  6  CREATININE 3.32* 3.29*  --  2.25*  CALCIUM 7.8* 7.5*  --  7.5*  PHOS 2.0* 2.0* 2.3* 2.8   Liver Function Tests: Recent Labs  Lab 09/22/17 0513 09/23/17 0440 09/23/17 0654  AST 23  --   --   ALT 10*  --   --   ALKPHOS 129*  --   --   BILITOT 1.6*  --   --   PROT 7.8  --   --   ALBUMIN 1.7* 1.5* 1.5*   CBC: Recent Labs  Lab 09/22/17 0513 09/23/17 0440 09/23/17 0654 09/25/17 0647  WBC 23.6* 18.3* 19.4* 14.8*  NEUTROABS 20.6*  --   --  12.5*  HGB 9.3* 8.5* 7.9* 8.0*  HCT 30.4* 28.3* 26.0* 26.2*  MCV 91.8 89.6 90.0 89.7  PLT 367 334 328 324   Medications: . sodium chloride    . piperacillin-tazobactam (ZOSYN)  IV Stopped (09/25/17 0017)   . apixaban  2.5 mg Oral BID  . [START ON 09/26/2017] doxercalciferol  1 mcg Intravenous Q T,Th,Sa-HD  . mouth rinse  15 mL Mouth Rinse BID  . sodium chloride flush  3 mL Intravenous Q12H  . sodium chloride flush  3 mL Intravenous Q12H  . timolol  1 drop Both Eyes BID    Dialysis Orders: TTS @ [facility] 4hr,  TDC, EDW 60.5kg (but left at 58.6kg last HD), 3K/2.25, heparin 4000 bolus - Hectorol IV q HD - Mircera 50 q2weeks (last given 09/21/17)  Assessment/Plan: 1. Dyspnea/Aspiration PNA - On Zosyn per primary. Recent prolonged hospital course at [facility] with PNA.  2. Hx Dysphasia, dysarthria  with previous CVAs 2. ESRD - TTS. Next HD 1/8  3. Hypokalemia - Using 4K bath with HD, supplementing. Follow trend  4. Anemia - Hgb trending down, ESA recently dosed 1/3.  5. MBD- Phos low, Improving with supplement, Continue Hectorol  6. HTN/volume -  BP stable. No volume overload on exam. 4kg below EDW by weights here  7. Severe PCM - Nutritional supplements per RD. 8. Sacral pressure ulcers - WOC following  9. Dispo - Wife refused palliative care consult on 1/6.   Ozzie Hoyle, PA-C 09/25/2017, 8:46 AM  [facility] Pager: [phone]  Pt seen, examined and agree w A/P as above.  Vinson Moselle MD  [facility] pager [phone]   09/25/2017, 2:52 PM

## 2017-09-25 NOTE — Progress Notes (Signed)
Subjective:  Patient observed laying comfortably in bed in no acute distress. Patient nonverbal at baseline but answers with nod yes or no. Patient indicates he feels well and is without complaint. Does not endorse fevers or chills. Denies cough.   Objective:  Vital signs in last 24 hours: Vitals:   09/24/17 1700 09/24/17 2125 09/25/17 0501 09/25/17 0847  BP: (!) 112/58 123/70 116/65 120/70  Pulse: 91 93 74 70  Resp: 16 12 16 16   Temp: 98.2 F (36.8 C) 99 F (37.2 C) 98.4 F (36.9 C) 98.7 F (37.1 C)  TempSrc: Oral   Oral  SpO2: 96% 100% 100% 100%  Weight:  123 lb 7.3 oz (56 kg)    Height:       Physical Exam  Constitutional:  Severely cachetic appearing man laying in bed in no acute distress. Does not spontaneously move extremities. Patient does cooperate with examiners. Severe muscle wasting throughout bilateral upper and lower extremities.  HENT:  Mouth/Throat: Oropharynx is clear and moist.  Eyes: EOM are normal. Pupils are equal, round, and reactive to light.  Cardiovascular: Normal rate, regular rhythm and intact distal pulses. Exam reveals no friction rub.  No murmur heard. Respiratory:  Poor effort. No crackles or wheezing appreciated.  GI: Soft. He exhibits no distension. There is no tenderness. There is no rebound.  Musculoskeletal: He exhibits deformity (Patient's bilateral upper and lower extremities severely contracted at baseline, unchanged from previous exam). He exhibits no edema (of bilateral lower extremities) or tenderness (of bilateral lower extremities).  Neurological:  Arouses to voice. Does not spontaneously move all 4 extremities.  Skin: Skin is warm and dry. No rash noted. No erythema.   Assessment/Plan:  Principal Problem:   Aspiration pneumonia (HCC) Active Problems:   DM type 2 causing ESRD (HCC)   ESRD on dialysis (HCC)   Pressure injury of sacral region, stage 2   Dysphagia due to old stroke   Hypokalemia   Severe protein-calorie  malnutrition (HCC)   Hypophosphatemia   Oropharyngeal dysphagia   ESRD (end stage renal disease) (HCC)   Goals of care, counseling/discussion   Advance care planning   Palliative care by specialist  Kenneth CheshireMichael Mckenzie 62 yo with PMH of dysphagia and recurrent aspiration 2/2 multiple prior CVAs, ESRD on HD T/Th/Sat, who presented with complaint of shortness of breath. Patient recently admitted and discharged from OSH on 09/19/2017 after 7 days IV zosyn for treatment of aspiration pneumonia. Patient admitted to internal medicine teaching service for management. The specific problems addressed during admission were as follows:  Aspiration PNA 2/2 Dysphagia from multiple previous CVAs: Patient presented afebrile and hemodynamically stable - he has remained in this state since admission. Current WBC down to 14.8 from 23.6 on admission with IV zosyn. Will continue IV antibiotics for now, at least until blood cultures result no growth at 72 hours.  -Dysphagia 1 diet -Blood cultures no growth at 48 hours.  -Continue zosyn as above  Hypokalemia and Hypophosphatemia: Patient received IV phosphorus, magnesium, and potassium replacement on admission. Will continue aggressive electrolyte replacement and electrolyte monitoring. Current K = 3.2 (09/24/17), Phos = 2.8 (09/24/17), Mg= 1.8 (09/23/17). AM labs pending -Follow up daily phos, BMP   Severe protein-calorie malnutrition: Per chart review family has dismissed idea of tube feeds in patient.  -Aggressive protein and nutritional supplementations per nutrition recommendations -Palliative care consult meeting with family today at noon  ESRD on HD T,Th,S:  -Nephrology consulted and appreciated  PAD: Patient found to have  arterial thrombus in Right LE during prior hospitalization on 12/12 and started on warfarin. Patient subtherapeutic when represented to hospital for evaluation on 12/25, so transitioned to Eliquis.  -Continue home Eliquis 2.5 mg BID  Stage II  Sacral Pressure Ulcers: Present on admission. No signs or symptoms of infection.  -Wound care Wound care consulted. Appreciate assistance from nursing staff  FEN/GI: -Dysphagia 1 per speech recommendations -No IVF, replacing electrolytes as needed (see above)  VTE Prophylaxis: Eliquis 2.5 mg daily Code Status: Full, verified on admission  Dispo: Anticipated discharge pending clinical improvement.   Rozann Lesches, MD 09/25/2017, 9:13 AM Pager: [phone]

## 2017-09-25 NOTE — Progress Notes (Signed)
No charge note:   Meeting with family at 1200.   Ocie BobKasie Dartanion Teo, AGNP-C Palliative Medicine  Please call Palliative Medicine team phone with any questions [phone]. For individual providers please see [facility].

## 2017-09-25 NOTE — Progress Notes (Signed)
Family requested if patient can have chest physiotherapy done, since he is wheelchair bound and isn't able to move around. MD notified. Verbal orders given to put in chest physiotherapy orders. Orders followed. Will continue to monitor.

## 2017-09-25 NOTE — Progress Notes (Signed)
Pharmacy Antibiotic Note  Kenneth Mckenzie is a 62 y.o. male admitted on 09/22/2017 with aspiration PNA.  Pharmacy has been consulted for Zosyn dosing  Patient is ESRD on HD.  Plan: Zosyn 3.375g IV q12h EI F/u de-escalation plan/LOT   Height: 6\' 3"  (190.5 cm) Weight: 123 lb 7.3 oz (56 kg) IBW/kg (Calculated) : 84.5  Temp (24hrs), Avg:98.6 F (37 C), Min:98.2 F (36.8 C), Max:99 F (37.2 C)  Recent Labs  Lab 09/22/17 0513 09/22/17 0811 09/22/17 1158 09/22/17 1208 09/23/17 0440 09/23/17 0654 09/24/17 0411 09/25/17 0647 09/25/17 1047  WBC 23.6*  --   --   --  18.3* 19.4*  --  14.8*  --   CREATININE 2.57*  --   --  2.86* 3.32* 3.29* 2.25*  --  3.45*  LATICACIDVEN 2.09* 2.94* 1.7  --   --   --   --   --   --     Estimated Creatinine Clearance: 17.6 mL/min (A) (by C-G formula based on SCr of 3.45 mg/dL (H)).    Allergies  Allergen Reactions  . Zofran [Ondansetron Hcl] Nausea And Vomiting    Zosyn 1/4 >> Cefepime 1/4 x1  1/4 BCx: ngtd 1/4 MRSA PCR: neg   Kaiyah Eber D. Conya Ellinwood, PharmD, BCPS Clinical Pharmacist Clinical Phone for 09/25/2017 until 3:30pm: [phone] If after 3:30pm, please call main pharmacy at [phone] 09/25/2017 2:22 PM

## 2017-09-25 NOTE — Progress Notes (Signed)
Daily Progress Note   Patient Name: Kenneth EdelsonMichael A Mckenzie       Date: 09/25/2017 DOB: 08-May-1956  Age: 62 y.o. MRN#: [id number] Attending Physician: Levert FeinsteinGranfortuna, James M, MD Primary Care Physician: Patient, No Pcp Per Admit Date: 09/22/2017  Reason for Consultation/Follow-up: Establishing goals of care  Subjective:  Kenneth Mckenzie contacted me this morning requesting to meet today.  Evaluated patient in bed- he is more awake today. Answering questions verbally with dysarthia. Denies pain. Requests that his legs be moved for comfort. We were able to discuss patient's overall illness trajectory, EOL expectations, and GOC.  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 His faith has always been of upmost importance. He has envisioned his death and described it to his wife including how he looked and how much grey was in his beard and hair.  Mrs. Kenneth Mckenzie feels that patient is trending toward EOL. She has good understanding of his poor nutritional status, inability to take in food to sustain life. She commented on his significant weight loss. He has been wheelchair bound or in the hospital for the last month. She expressed concerns about his pressure wounds and risk for infection- I reviewed information r/t the need for nutrition and protein to be able to heal and fight infection - reviewed patient's albumin level of 1.7 indicating poor reserve and poor prognosis. Also discussed that this affects his ability to recover fully from pneumonia. Her current GOC for his is to be able to return home and spend time with family. We discussed how returning to hospital for treatment for recurring pneumonia, may not align with that goal. We discussed the difference between continued aggressive medical  care and comfort care. Hospice services were discussed, however, she is reluctant to stop dialysis- she does state that if the nephrologist were to say he was not a good candidate for continued dialysis, or if he were unable to be transported from home to dialysis, then that may be the end point for dialysis. We discussed that continuing life prolonging measures including repeat hospitalizations and dialysis- could also potentially prolong suffering from pressure wounds, inability to eat or drink, and recurrent pneumonias. Spouse agreed to consider this point. She does feel that her husband will express when he is ready to stop dialysis.  Code status was  discussed- she requests DNR as she believes performing full resuscitative efforts would not improve patient's function and if patient were to be resuscitated he would be in worse functional state due to resuscitative efforts.    Review of Systems  Unable to perform ROS: Acuity of condition    Length of Stay: 3  Current Medications: Scheduled Meds:  . apixaban  2.5 mg Oral BID  . [START ON 09/26/2017] doxercalciferol  1 mcg Intravenous Q T,Th,Sa-HD  . mouth rinse  15 mL Mouth Rinse BID  . sodium chloride flush  3 mL Intravenous Q12H  . sodium chloride flush  3 mL Intravenous Q12H  . timolol  1 drop Both Eyes BID    Continuous Infusions: . sodium chloride    . piperacillin-tazobactam (ZOSYN)  IV 3.375 g (09/25/17 1027)    PRN Meds: sodium chloride, RESOURCE THICKENUP CLEAR, sodium chloride flush  Physical Exam  Constitutional:  Severe cachexia  Musculoskeletal:  Weakness, muscle wasting   Skin: Skin is warm and dry.  Psychiatric:  Flat affect  Nursing note and vitals reviewed.           Vital Signs: BP 120/70 (BP Location: Left Arm)   Pulse 70   Temp 98.7 F (37.1 C) (Oral)   Resp 16   Ht 6\' 3"  (1.905 m)   Wt 56 kg (123 lb 7.3 oz)   SpO2 100%   BMI 15.43 kg/m  SpO2: SpO2: 100 % O2 Device: O2 Device: Nasal Cannula O2 Flow  Rate: O2 Flow Rate (L/min): 2 L/min  Intake/output summary:   Intake/Output Summary (Last 24 hours) at 09/25/2017 1313 Last data filed at 09/25/2017 1027 Gross per 24 hour  Intake 336 ml  Output 0 ml  Net 336 ml   LBM: Last BM Date: 09/25/17 Baseline Weight: Weight: 59 kg (130 lb 1.1 oz) Most recent weight: Weight: 56 kg (123 lb 7.3 oz)       Palliative Assessment/Data: PPS: 10%     Patient Active Problem List   Diagnosis Date Noted  . Oropharyngeal dysphagia 09/24/2017  . ESRD (end stage renal disease) (HCC)   . Goals of care, counseling/discussion   . Advance care planning   . Palliative care by specialist   . Aspiration pneumonia (HCC) 09/22/2017  . Pressure injury of sacral region, stage 2 09/22/2017  . 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 Hypokalemia 09/22/2017  . Severe protein-calorie malnutrition (HCC) 09/22/2017  . Hypophosphatemia 09/22/2017  . Acute upper GI bleed 05/29/2016  . Gastritis and gastroduodenitis 05/29/2016  . Aphasia complicating stroke 05/29/2016  . ESRD on dialysis (HCC) 05/29/2016  . Hyperlipidemia associated with type 2 diabetes mellitus (HCC) 05/29/2016  . Glaucoma due to secondary diabetes (HCC) 05/29/2016  . DM type 2 causing ESRD (HCC)   . Hypertension associated with stage 5 chronic kidney disease due to type 2 diabetes mellitus (HCC)   . Renal disease   . Coronary heart disease   . CVA (cerebrovascular accident) [facility])     Palliative Care Assessment & Plan   Patient Profile: 62 y.o. male  with past medical history of ESRD on dialysis, DM, bipolar d/o, CVA's w/ resulting dysphagia and dysarthria, ischemic limb, hx recent admissions to different facility for likely aspiration pneumonia, admitted on 09/22/2017 with SOB. Workup reveals likely recurrent aspiration pneumonia, malnutrition. Palliative medicine consulted for GOC.   Assessment/Recommendations/Plan   DNR  Continue current scope of care, hopeful for d/c home  Spouse  agreed to outpatient palliative f/u for  continued GOC  PT eval- will patient be able to sit for outpatient dialysis? Can he tolerate transport to outpatient dialysis? (I suspect the answer is no, however, patient's spouse is certain that he can. She feels the patient wishes to continue dialysis and will pursue aggressive medical care until she is told he is unable to tolerate outpatient dialysis. She would not want him to have to reside in a nursing facility just to receive dialysis  No artificial feeding  Referral for outpatient palliative for continued GOC at discharge   Goals of Care and Additional Recommendations:  Limitations on Scope of Treatment: Full Scope Treatment  Code Status:  DNR  Prognosis:   Unable to determine definitely limited to weeks to months due to continued aspiration pneumonia in the setting of failure to thrive, ESRD on dialysis  Discharge Planning:  Home with Palliative Services  Care plan was discussed with patient's spouse.  Thank you for allowing the Palliative Medicine Team to assist in the care of this patient.   Time In: 1200 Time Out: 1330 Total Time 90 mins Prolonged Time Billed Yes      Greater than 50%  of this time was spent counseling and coordinating care related to the above assessment and plan.  Ocie Bob, AGNP-C Palliative Medicine   Please contact Palliative Medicine Team phone at [phone] for questions and concerns.

## 2017-09-26 DIAGNOSIS — Z66 Do not resuscitate: Secondary | ICD-10-CM

## 2017-09-26 DIAGNOSIS — N186 End stage renal disease: Secondary | ICD-10-CM

## 2017-09-26 DIAGNOSIS — Z515 Encounter for palliative care: Secondary | ICD-10-CM

## 2017-09-26 DIAGNOSIS — I69391 Dysphagia following cerebral infarction: Secondary | ICD-10-CM

## 2017-09-26 LAB — BASIC METABOLIC PANEL
Anion gap: 18 — ABNORMAL HIGH (ref 5–15)
BUN: 18 mg/dL (ref 6–20)
CHLORIDE: 102 mmol/L (ref 101–111)
CO2: 22 mmol/L (ref 22–32)
Calcium: 8 mg/dL — ABNORMAL LOW (ref 8.9–10.3)
Creatinine, Ser: 4.34 mg/dL — ABNORMAL HIGH (ref 0.61–1.24)
GFR calc Af Amer: 15 mL/min — ABNORMAL LOW (ref >60)
GFR calc non Af Amer: 13 mL/min — ABNORMAL LOW (ref >60)
Glucose, Bld: 116 mg/dL — ABNORMAL HIGH (ref 65–99)
POTASSIUM: 3 mmol/L — AB (ref 3.5–5.1)
SODIUM: 142 mmol/L (ref 135–145)

## 2017-09-26 LAB — MAGNESIUM: MAGNESIUM: 2.3 mg/dL (ref 1.7–2.4)

## 2017-09-26 LAB — PHOSPHORUS: PHOSPHORUS: 3.4 mg/dL (ref 2.5–4.6)

## 2017-09-26 MED ORDER — DOXERCALCIFEROL 4 MCG/2ML IV SOLN
INTRAVENOUS | Status: AC
  Filled 2017-09-26: qty 2

## 2017-09-26 MED ORDER — HEPARIN SODIUM (PORCINE) 1000 UNIT/ML DIALYSIS: 20.0000 [IU]/kg | INTRAMUSCULAR | Status: DC | PRN

## 2017-09-26 MED ORDER — POTASSIUM CHLORIDE 10 MEQ/100ML IV SOLN: 10.0000 meq | INTRAVENOUS | Status: DC

## 2017-09-26 NOTE — Progress Notes (Signed)
Subjective:  Patient seen laying comfortably in bed during HD today. Patient did not appear to be in any acute distress. Nods no when asked if he is feeling bad, worsened cough, and/or other discomfort. Stated he would be able to go home today, the patient indicated no complaints or problems with this plan.  Objective:  Vital signs in last 24 hours: Vitals:   09/25/17 0847 09/25/17 1601 09/25/17 2224 09/26/17 0441  BP: 120/70 128/77 (!) 123/58 129/76  Pulse: 70 83 83 92  Resp: 16 16 (!) 22 (!) 21  Temp: 98.7 F (37.1 C)  98.7 F (37.1 C) 98.6 F (37 C)  TempSrc: Oral  Oral Oral  SpO2: 100% 100% 91% 92%  Weight:   125 lb 0 oz (56.7 kg)   Height:       Physical Exam  Constitutional:  Severely cachetic appearing man laying in bed in no acute distress, unchanged from previous exams.  Cardiovascular: Normal rate, regular rhythm and intact distal pulses. Exam reveals no friction rub.  No murmur heard. Respiratory:  Poor effort. No crackles or wheezing appreciated in anterior lung fields. Intermittent upper airway rhonchi heard.   Musculoskeletal: He exhibits deformity (Patient's bilateral upper extremities contracted at baseline, unchanged from previous exam, patient's legs straight today and he appeaers very comfortable). He exhibits no edema (of bilateral lower extremities) or tenderness (of bilateral lower extremities).  Neurological:  Awake, alert. Cooperates with examiners but is unable to spontaneously move all 4 extremities during exam and doesn't cooperate with formal strength testing.   Skin: Skin is warm and dry. No rash noted. No erythema.  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 Patient recently admitted and discharged from OSH on 09/19/2017 after 7 days IV zosyn for treatment of aspiration pneumonia. He was admitted to internal medicine teaching service for management. The specific problems addressed during admission were as follows:  Aspiration PNA 2/2 Dysphagia from multiple previous CVAs: Patient presented afebrile and hemodynamically stable - he has remained in this state since admission. WBC down to 14.8 from 23.6 on admission with IV antibiotics. Will plan for 1 more day of IV antibiotics for a total of 5 days of therapy as the patient has remained afebrile and hemodynamically stable. -IV zosyn, day 5/5 today, no further antibiotics required -Dysphagia 1 diet -Blood cultures no growth at 72 hours -PT/OT recommend SNF, per palliative discussion patient not to go to SNF but rather home with health services. Attempting to contact wife via phone today. -Case management consulted for home health services  Severe protein-calorie malnutrition: Per chart review family has dismissed idea of tube feeds in patient. Per discussion with palliative care yesterday patient does not want feeding tube and wants D/C home with outpatient palliative care follow up. Wife has understanding that his current nutritional status represents poor reserve for  wound healing and she seems to understand dysphagia/recurrent aspiration risk.  -Aggressive protein and nutritional supplementations per nutrition recommendations -Palliative care will follow as outpatient  Hypokalemia and Hypophosphatemia: Patient received IV  phosphorus, magnesium, and potassium replacement on admission. Current K = 3.0 (3.7 on 09/25/17), Phos = 3.4, Mg= 2.3.   -20 mEq IV potassium today, gentle correction given ESRD on HD -Follow up daily phos, BMP   ESRD on HD T,Th,S:  -Nephrology consulted and appreciated  PAD: Patient found to have arterial thrombus in Right LE during prior hospitalization on 12/12 and started on warfarin. Patient subtherapeutic when represented to hospital for evaluation on 12/25, so transitioned to Eliquis.  -Continue home Eliquis 2.5 mg BID  Stage II Sacral Pressure Ulcers: Present on admission. No signs or symptoms of infection. Nutritional status and deficits suggest poor prognosis for wound healing. Will continue efforts to promote healing and prevent infection. -Wound care consulted. Appreciate assistance from nursing staff  FEN/GI: -Dysphagia 1 per speech recommendations -No IVF, replacing electrolytes as needed (see above)  VTE Prophylaxis: Eliquis 2.5 mg daily Code Status: DNR, per palliative care discussion yesterday  Dispo: Anticipated discharge 0-1 days home with outpatient palliative care.  [name], [name], MD 09/26/2017, 6:52 AM Pager: [phone]

## 2017-09-26 NOTE — Progress Notes (Signed)
  Speech Language Pathology Treatment: Dysphagia  Patient Details Name: Kenneth Mckenzie MRN: [id number] DOB: 07-11-1956 Today's Date: 09/26/2017 Time: [id number] SLP Time Calculation (min) (ACUTE ONLY): 12 min  Assessment / Plan / Recommendation Clinical Impression  Pt eating minimally per RN.  He was willing to consume several sips of liquids thickened to honey, which elicited inconsistent throat clear/wet cough. He answered a few biographical questions verbally, then ceased talking for the rest of our session. Pt with severe chronic dysphagia - he has been eating a dysphagia 1 diet with pudding thick liquids at [facility], but despite such strict modifications continues to have intermittent aspiration and recurring pna. Palliative medicine is working closely with pt and family - will follow along for GOC. Pursuing further objective studies of swallowing would not be of value at this point.     HPI HPI: Pt is a 62 y/o male admitted secondary to HCAP, hypokalemia and ESRD. PMH including but not limited to HTN, HLD, DM, ESRD and prior CVAs. Per care everywhere, [facility]; family decided against feeding tube placement and accepting of aspiration risks. [facility] puree/pudding thick liquids. Referred for swallowing evaluation.      SLP Plan  Continue with current plan of care       Recommendations  Diet recommendations: Dysphagia 1 (puree);Pudding-thick liquid Liquids provided via: Teaspoon Medication Administration: Crushed with puree Supervision: Trained caregiver to feed patient Compensations: Slow rate;Small sips/bites;Minimize environmental distractions                Oral Care Recommendations: Oral care before and after PO SLP Visit Diagnosis: Dysphagia, oropharyngeal phase (R13.12) Plan: Continue with current plan of care       GO                Blenda MountsCouture, Ulanda Tackett Laurice 09/26/2017, 3:52  PM

## 2017-09-26 NOTE — Care Management Important Message (Signed)
Important Message  Patient Details  Name: Marlis EdelsonMichael A Altice MRN: [id number] Date of Birth: 01/28/1956   Medicare Important Message Given:  Yes    Danton Palmateer Stefan ChurchBratton 09/26/2017, 3:48 PM

## 2017-09-26 NOTE — Progress Notes (Signed)
Pt requesting to return to bed after 20 minutes citing discomfort. Pt assisted using AP transfer. Pt does not have any acute OT needs. Signing off.   09/26/17 1447  OT Visit Information  Last OT Received On 09/26/17  Assistance Needed +2  PT/OT/SLP Co-Evaluation/Treatment Yes  Reason for Co-Treatment Complexity of the patient's impairments (multi-system involvement)  History of Present Illness Pt is a 62 y/o male admitted secondary to HCAP, hypokalemia and ESRD. PMH including but not limited to HTN, HLD, DM, ESRD and prior CVAs.  Precautions  Precautions Fall  Precaution Comments contracted L UE from prior CVAs  Pain Assessment  Pain Assessment Faces  Pain Score 6  Faces Pain Scale 6  Pain Location generalized  Pain Descriptors / Indicators Grimacing  Pain Intervention(s) Monitored during session;Repositioned  Cognition  Arousal/Alertness Awake/alert  Behavior During Therapy Flat affect  Overall Cognitive Status History of cognitive impairments - at baseline  General Comments pt yelling out to be put back to bed  Difficult to assess due to Impaired communication  Bed Mobility  Overal bed mobility Needs Assistance  Bed Mobility Sit to Supine  Sit to supine Total assist  General bed mobility comments total A  Transfers  Overall transfer level Needs assistance  Transfers Anterior-Posterior Transfer  Anterior-Posterior transfers Total assist;+2 physical assistance  General transfer comment returned to bed  OT - End of Session  Equipment Utilized During Treatment Oxygen  Activity Tolerance Patient tolerated treatment well  Patient left in bed;with call bell/phone within reach (RT in room)  Nurse Communication (aware pt could not tolerate chair)  OT Assessment/Plan  OT Plan Discharge plan remains appropriate  OT Visit Diagnosis Muscle weakness (generalized) (M62.81);Pain;Hemiplegia and hemiparesis  Follow Up Recommendations No OT follow up;Supervision/Assistance - 24 hour   AM-PAC OT "6 Clicks" Daily Activity Outcome Measure  Help from another person eating meals? 1  Help from another person taking care of personal grooming? 1  Help from another person toileting, which includes using toliet, bedpan, or urinal? 1  Help from another person bathing (including washing, rinsing, drying)? 1  Help from another person to put on and taking off regular upper body clothing? 1  Help from another person to put on and taking off regular lower body clothing? 1  6 Click Score 6  ADL G Code Conversion CN  OT Goal Progression  Progress towards OT goals Not progressing toward goals - comment;Goals met/education completed, patient discharged from OT  Acute Rehab OT Goals  Patient Stated Goal to return to bed  OT Goal Formulation All assessment and education complete, DC therapy  Potential to Achieve Goals Poor  OT Time Calculation  OT Start Time (ACUTE ONLY) 1420  OT Stop Time (ACUTE ONLY) 1438  OT Time Calculation (min) 18 min  OT General Charges  $OT Visit 1 Visit  OT Treatments  $Therapeutic Activity 8-22 mins  09/26/2017 Nestor Lewandowsky, OTR/L Pager: [phone]

## 2017-09-26 NOTE — Progress Notes (Signed)
Physical Therapy Treatment Patient Details Name: Kenneth Mckenzie MRN: [id number] DOB: Aug 16, 1956 Today's Date: 09/26/2017    History of Present Illness Pt is a 62 y/o male admitted secondary to HCAP, hypokalemia and ESRD. PMH including but not limited to HTN, HLD, DM, ESRD and prior CVAs.    PT Comments    Patient seen for OOB activity tolerance. Assisted patient to EOB and OOB to chair. Patient continues to remain total assist for all aspects of mobility at this time. Educated patient on goal of OOB >3 hours. Observed patient and positioning in chair. Difficulty positioning patient due to contractures. Will follow.   Follow Up Recommendations  Home health PT;Supervision/Assistance - 24 hour;Other (comment)(HH aide)     Equipment Recommendations  None recommended by PT    Recommendations for Other Services       Precautions / Restrictions Precautions Precautions: Fall Precaution Comments: contracted L UE from prior CVAs    Mobility  Bed Mobility Overal bed mobility: Needs Assistance Bed Mobility: Supine to Sit     Supine to sit: Total assist;HOB elevated Sit to supine: Total assist   General bed mobility comments: total A  Transfers Overall transfer level: Needs assistance Equipment used: (draw sheet total assist transfer) Transfers: Anterior-Posterior Transfer       Anterior-Posterior transfers: Total assist;+2 physical assistance   General transfer comment: patient requires total assist for all aspects of care and mobility. Patient showing no true functional movement at this time, some random active LE but unable to sustain task or positioning.  Ambulation/Gait                 Stairs            Wheelchair Mobility    Modified Rankin (Stroke Patients Only)       Balance Overall balance assessment: Needs assistance Sitting-balance support: No upper extremity supported;Feet supported Sitting balance-Leahy Scale: Zero Sitting balance -  Comments: Total A to maintain sitting balance                                    Cognition Arousal/Alertness: Awake/alert Behavior During Therapy: Flat affect Overall Cognitive Status: History of cognitive impairments - at baseline                                 General Comments: pt can make choices from 2 different foods and verbalized when wanting to return to bed.      Exercises      General Comments        Pertinent Vitals/Pain Pain Assessment: Faces Faces Pain Scale: Hurts even more Pain Location: generalized Pain Descriptors / Indicators: Grimacing Pain Intervention(s): Monitored during session;Repositioned    Home Living                      Prior Function            PT Goals (current goals can now be found in the care plan section) Acute Rehab PT Goals Patient Stated Goal: unable to state PT Goal Formulation: With family Time For Goal Achievement: 10/06/17 Potential to Achieve Goals: Fair Progress towards PT goals: Not progressing toward goals - comment    Frequency    Other (Comment)(PT trial, currently no true acute needs pt total assist)      PT Plan Frequency needs to  be updated    Co-evaluation PT/OT/SLP Co-Evaluation/Treatment: Yes Reason for Co-Treatment: Complexity of the patient's impairments (multi-system involvement);For patient/therapist safety;To address functional/ADL transfers PT goals addressed during session: Mobility/safety with mobility OT goals addressed during session: ADL's and self-care      AM-PAC PT "6 Clicks" Daily Activity  Outcome Measure  Difficulty turning over in bed (including adjusting bedclothes, sheets and blankets)?: Unable Difficulty moving from lying on back to sitting on the side of the bed? : Unable Difficulty sitting down on and standing up from a chair with arms (e.g., wheelchair, bedside commode, etc,.)?: Unable Help needed moving to and from a bed to chair  (including a wheelchair)?: Total Help needed walking in hospital room?: Total Help needed climbing 3-5 steps with a railing? : Total 6 Click Score: 6    End of Session Equipment Utilized During Treatment: Oxygen Activity Tolerance: Patient limited by fatigue;Patient limited by pain Patient left: in chair;with call bell/phone within reach Nurse Communication: Mobility status;Need for lift equipment PT Visit Diagnosis: Other abnormalities of gait and mobility (R26.89);Muscle weakness (generalized) (M62.81);Other symptoms and signs involving the nervous system (R29.898)     Time: [id number] PT Time Calculation (min) (ACUTE ONLY): 23 min  Charges:  $Therapeutic Activity: 8-22 mins                    G Codes:       Charlotte Crumbevon Kanyla Omeara, PT DPT  Board Certified Neurologic Specialist [phone]    Fabio AsaDevon J Wendee Hata 09/26/2017, 2:41 PM

## 2017-09-26 NOTE — Progress Notes (Signed)
[facility] Progress Note   Subjective:  Seen on HD, 0.5L goal. BP low side, reduced to no UF. Resting comfortably, shakes head no in regard to dyspnea or CP. Now DNR per palliative notes.  Objective Vitals:   09/26/17 0820 09/26/17 0825 09/26/17 0855 09/26/17 0920  BP: (!) 86/56 106/62 98/64 (!) 105/56  Pulse: 85 82 84 87  Resp: (!) 24 (!) 26 (!) 25 (!) 25  Temp:      TempSrc:      SpO2:      Weight:      Height:       Physical Exam General: Frail man, NAD. On nasal oxygen. Contractures in UE. Heart: RRR; no murmur Lungs: CTA anteriorly Extremities: No LE edema Dialysis Access: TDC in L chest; no erythema  Additional Objective Labs: Basic Metabolic Panel: Recent Labs  Lab 09/24/17 0411 09/25/17 1047 09/26/17 0524  NA 134* 138 142  K 3.2* 3.6 3.0*  CL 96* 101 102  CO2 26 24 22   GLUCOSE 154* 94 116*  BUN 6 13 18   CREATININE 2.25* 3.45* 4.34*  CALCIUM 7.5* 7.9* 8.0*  PHOS 2.8 3.3 3.4   Liver Function Tests: Recent Labs  Lab 09/22/17 0513 09/23/17 0440 09/23/17 0654  AST 23  --   --   ALT 10*  --   --   ALKPHOS 129*  --   --   BILITOT 1.6*  --   --   PROT 7.8  --   --   ALBUMIN 1.7* 1.5* 1.5*   CBC: Recent Labs  Lab 09/22/17 0513 09/23/17 0440 09/23/17 0654 09/25/17 0647  WBC 23.6* 18.3* 19.4* 14.8*  NEUTROABS 20.6*  --   --  12.5*  HGB 9.3* 8.5* 7.9* 8.0*  HCT 30.4* 28.3* 26.0* 26.2*  MCV 91.8 89.6 90.0 89.7  PLT 367 334 328 324   Medications: . sodium chloride    . piperacillin-tazobactam (ZOSYN)  IV Stopped (09/26/17 0320)   . apixaban  2.5 mg Oral BID  . doxercalciferol  1 mcg Intravenous Q T,Th,Sa-HD  . mouth rinse  15 mL Mouth Rinse BID  . sodium chloride flush  3 mL Intravenous Q12H  . sodium chloride flush  3 mL Intravenous Q12H  . timolol  1 drop Both Eyes BID    Dialysis Orders: TTS @ [facility] 4hr, TDC, EDW 60.5kg (but left at 58.6kg last HD), 3K/2.25, heparin 4000 bolus - Hectorol 1mcg IV q HD -  Mircera 50 q2weeks (last given 09/21/17)  Assessment/Plan: 1. Dyspnea/Aspiration PNA - On Zosyn per primary. Recent prolonged hospital course at [facility] with PNA. 2. HxDysphasia, dysarthriawith previous CVAs 2. ESRD - Continuing HD per TTS schedule. 3. Hypokalemia - Using 4K bath with HD, supplementing. Follow trend. 4. Anemia - Hgb trending down, ESA recently dosed 1/3. 5. MBD- Phos low,Improving with supplement, continue Hectorol.  6. HTN/volume - BP stable. No volume overload on exam. 4kg below EDW by weights here  7. Severe PCM -Nutritional supplements per RD. 8. Sacral pressure ulcers - WOC following 9. Dispo: S/p palliative care consult. Now DNR, but continuing HD for now.     Ozzie HoyleKatie Stovall, PA-C 09/26/2017, 10:44 AM  [facility] Pager: [phone]  Pt seen, examined, agree w assess/plan as above with additions as indicated. Needs to be able to dialyze sitting up in a chair to be dc'd to SNF or to home.  Today was in the bed. Plan next dialysis will be up in a chair and  see if pt can tolerate.  Vinson Moselle MD [employer] pager [phone]    cell [phone] 09/26/2017, 3:10 PM

## 2017-09-26 NOTE — Progress Notes (Signed)
OT Cancellation Note  Patient Details Name: Kenneth EdelsonMichael A Curvin MRN: [id number] DOB: [birth year]   Cancelled Treatment:    Reason Eval/Treat Not Completed: Patient at procedure or test/ unavailable(HD) Will follow.  Evern BioMayberry, Barnaby Rippeon Lynn 09/26/2017, 8:31 AM  09/26/2017 Martie RoundJulie Luby Seamans, OTR/L Pager: [phone]

## 2017-09-26 NOTE — Progress Notes (Signed)
Return visit as patient insistent on returning back to bed. Patient OOB to chair ~20 minutes total.  At this time, do not feel patient could safely tolerate chair positioning for >30 minutes without total assist for repositioning as patient demonstrates no ability to perform self repositioning or pressure relief. Given body structure, contractures and limited mobility, feel patient would be at extremely high risk for breakdown and pressure concerns related to skin integrity at this time. Patient also expressing verbally and via head nods his discomfort and desire to return to bed. At this time, do not feel patient is appropriate for continued skilled acute services, recommend continued OOB trials using hoyer lift and will defer all further needs to next venue of care. Acute PT services to sign off.  Kenneth Mckenzie, PT DPT  Board Certified Neurologic Specialist [phone]     09/26/17 1442  PT Visit Information  Last PT Received On 09/26/17  Assistance Needed +2  PT/OT/SLP Co-Evaluation/Treatment Yes  Reason for Co-Treatment Complexity of the patient's impairments (multi-system involvement)  PT goals addressed during session Mobility/safety with mobility  OT goals addressed during session ADL's and self-care  History of Present Illness Pt is a 62 y/o male admitted secondary to HCAP, hypokalemia and ESRD. PMH including but not limited to HTN, HLD, DM, ESRD and prior CVAs.  Subjective Data  Subjective insistent on return to bed  Precautions  Precautions Fall  Precaution Comments contracted L UE from prior CVAs  Pain Assessment  Pain Assessment Faces  Faces Pain Scale 6  Pain Location generalized  Pain Descriptors / Indicators Grimacing  Pain Intervention(s) Monitored during session  Cognition  Arousal/Alertness Awake/alert  Behavior During Therapy Flat affect  Overall Cognitive Status History of cognitive impairments - at baseline  Difficult to assess due to Impaired communication   Bed Mobility  Overal bed mobility Needs Assistance  Bed Mobility Supine to Sit  Supine to sit Total assist;HOB elevated  Sit to supine Total assist  General bed mobility comments total A  Transfers  Overall transfer level Needs assistance  Equipment used (draw sheet total assist transfer)  Transfers Anterior-Posterior Transfer  Anterior-Posterior transfers Total assist;+2 physical assistance  General transfer comment patient requires total assist for all aspects of care and mobility. Patient showing no true functional movement at this time, some random active LE but unable to sustain task or positioning.  Balance  Overall balance assessment Needs assistance  Sitting-balance support No upper extremity supported;Feet supported  Sitting balance-Leahy Scale Zero  Sitting balance - Comments Total A to maintain sitting balance  PT - End of Session  Equipment Utilized During Treatment Oxygen  Activity Tolerance Patient limited by fatigue;Patient limited by pain  Patient left in bed;with call bell/phone within reach, resp. Therapist in room  Nurse Communication Mobility status;Need for lift equipment  PT - Assessment/Plan  PT Plan Frequency needs to be updated  PT Visit Diagnosis Other abnormalities of gait and mobility (R26.89);Muscle weakness (generalized) (M62.81);Other symptoms and signs involving the nervous system (R29.898)  PT Frequency (ACUTE ONLY) Other (Comment) (PT trial, currently no true acute needs pt total assist)  Follow Up Recommendations Home health PT;Supervision/Assistance - 24 hour;Other (comment) (HH aide)  PT equipment None recommended by PT  AM-PAC PT "6 Clicks" Daily Activity Outcome Measure  Difficulty turning over in bed (including adjusting bedclothes, sheets and blankets)? 1  Difficulty moving from lying on back to sitting on the side of the bed?  1  Difficulty sitting down on and standing up from  a chair with arms (e.g., wheelchair, bedside commode, etc,.)? 1   Help needed moving to and from a bed to chair (including a wheelchair)? 1  Help needed walking in hospital room? 1  Help needed climbing 3-5 steps with a railing?  1  6 Click Score 6  Mobility G Code  CN  Acute Rehab PT Goals  PT Goal Formulation With family  Time For Goal Achievement 10/06/17  Potential to Achieve Goals Fair  PT Time Calculation  PT Start Time (ACUTE ONLY) 1418  PT Stop Time (ACUTE ONLY) 1441  PT Time Calculation (min) (ACUTE ONLY) 23 min  PT General Charges  $$ ACUTE PT VISIT 1 Visit  PT Treatments  $Therapeutic Activity 8-22 mins   Kenneth Crumbevon Kenneth Mckenzie, PT DPT  Board Certified Neurologic Specialist [phone]

## 2017-09-26 NOTE — Progress Notes (Signed)
PT Cancellation Note  Patient Details Name: Marlis EdelsonMichael A Kriz MRN: [id number] DOB: 1956/02/21   Cancelled Treatment:    Reason Eval/Treat Not Completed: Patient at procedure or test/unavailable, at HD will follow as time allows   Fabio AsaDevon J Zhane Donlan 09/26/2017, 8:54 AM

## 2017-09-26 NOTE — Progress Notes (Signed)
Occupational Therapy Treatment Patient Details Name: Kenneth Mckenzie MRN: [id number] DOB: 27-Jul-1956 Today's Date: 09/26/2017    History of present illness Pt is a 62 y/o male admitted secondary to HCAP, hypokalemia and ESRD. PMH including but not limited to HTN, HLD, DM, ESRD and prior CVAs.   OT comments  Pt transferred using AP technique from bed to chair with total assist +2. Positioned pt for comfort with pillows and towel roll. Attempted hand over hand assist for self feeding, but pt with increased flexor tone and limited ROM in R UE, requiring total assist. Educated pt in importance of increasing sitting tolerance in chair for community-based HD.   Follow Up Recommendations  No OT follow up;Supervision/Assistance - 24 hour    Equipment Recommendations       Recommendations for Other Services      Precautions / Restrictions Precautions Precautions: Fall Precaution Comments: contracted L UE from prior CVAs       Mobility Bed Mobility Overal bed mobility: Needs Assistance Bed Mobility: Supine to Sit     Supine to sit: Total assist;HOB elevated Sit to supine: Total assist   General bed mobility comments: total A  Transfers Overall transfer level: Needs assistance Equipment used: (draw sheet total assist transfer) Transfers: Anterior-Posterior Transfer       Anterior-Posterior transfers: Total assist;+2 physical assistance   General transfer comment: patient requires total assist for all aspects of care and mobility. Patient showing no true functional movement at this time, some random active LE but unable to sustain task or positioning.    Balance Overall balance assessment: Needs assistance Sitting-balance support: No upper extremity supported;Feet supported Sitting balance-Leahy Scale: Zero Sitting balance - Comments: Total A to maintain sitting balance                                   ADL either performed or assessed with clinical judgement    ADL                                         General ADL Comments: assisted with lunch meal     Vision       Perception     Praxis      Cognition Arousal/Alertness: Awake/alert Behavior During Therapy: Flat affect Overall Cognitive Status: History of cognitive impairments - at baseline                                 General Comments: pt can make choices from 2 different foods and verbalized when wanting to return to bed.        Exercises     Shoulder Instructions       General Comments      Pertinent Vitals/ Pain       Pain Assessment: Faces Faces Pain Scale: Hurts even more Pain Location: generalized Pain Descriptors / Indicators: Grimacing Pain Intervention(s): Monitored during session;Repositioned  Home Living                                          Prior Functioning/Environment              Frequency  Progress Toward Goals  OT Goals(current goals can now be found in the care plan section)  Progress towards OT goals: Not progressing toward goals - comment(pt is at his baseline)  Acute Rehab OT Goals Patient Stated Goal: unable to state OT Goal Formulation: Patient unable to participate in goal setting Time For Goal Achievement: 10/07/17 Potential to Achieve Goals: Poor  Plan Discharge plan remains appropriate    Co-evaluation    PT/OT/SLP Co-Evaluation/Treatment: Yes Reason for Co-Treatment: Complexity of the patient's impairments (multi-system involvement);For patient/therapist safety;To address functional/ADL transfers PT goals addressed during session: Mobility/safety with mobility OT goals addressed during session: ADL's and self-care      AM-PAC PT "6 Clicks" Daily Activity     Outcome Measure   Help from another person eating meals?: Total Help from another person taking care of personal grooming?: Total Help from another person toileting, which includes using  toliet, bedpan, or urinal?: Total Help from another person bathing (including washing, rinsing, drying)?: Total Help from another person to put on and taking off regular upper body clothing?: Total Help from another person to put on and taking off regular lower body clothing?: Total 6 Click Score: 6    End of Session    OT Visit Diagnosis: Other abnormalities of gait and mobility (R26.89);Unsteadiness on feet (R26.81);Muscle weakness (generalized) (M62.81);Pain;Hemiplegia and hemiparesis;Other symptoms and signs involving cognitive function   Activity Tolerance Patient tolerated treatment well   Patient Left in chair;with call bell/phone within reach   Nurse Communication Mobility status(AP transfer technique)        Time: [id number] OT Time Calculation (min): 27 min  Charges: OT General Charges $OT Visit: 1 Visit OT Treatments $Self Care/Home Management : 8-22 mins  09/26/2017 Martie RoundJulie Sherif Millspaugh, OTR/L Pager: [phone]  Iran PlanasMayberry, [place] BailiffJulie Lynn 09/26/2017, 2:44 PM

## 2017-09-26 NOTE — Progress Notes (Signed)
Medicine attending: I examined this patient today together with resident physician Dr. Rozann LeschesMarybeth Nedrud and I concur with her evaluation and management plan which we discussed together. We greatly appreciate palliative care consultation.  The had a meeting with the patient and his wife yesterday.  Goals of care outlined.  Patient has strong desire to return to his home.  He does not want a feeding tube.  Decision made against cardiac resuscitation and mechanical ventilation in the event of an irreversible deterioration in his condition.  Otherwise decision made to continue dialysis as long as patient is able to get to the center and tolerated the procedure. Unfortunately, we anticipate repetitive hospitalizations for recurrent aspiration. He remains afebrile day 5 Zosyn.  White count trending down.  No CBC today. Anticipate he could be discharged as early as this afternoon after current dialysis session is complete and if appropriate transportation can be arranged.

## 2017-09-26 NOTE — Discharge Summary (Signed)
Name: Kenneth Mckenzie MRN: [id number] DOB: 23-Oct-1955 62 y.o. PCP: Patient, No Pcp Per  Date of Admission: 09/22/2017  4:51 AM Date of Discharge: 09/29/2017 Attending Physician: Levert Feinstein, MD  Discharge Diagnosis:  Principal Problem:   Aspiration pneumonia [facility]) Active Problems:   DM type 2 causing ESRD (HCC)   ESRD on dialysis (HCC)   Pressure injury of sacral region, stage 2   Dysphagia due to old stroke   Hypokalemia   Severe protein-calorie malnutrition (HCC)   Hypophosphatemia   Oropharyngeal dysphagia   ESRD (end stage renal disease) (HCC)   Goals of care, counseling/discussion   Advance care planning   Palliative care by specialist   Healthcare-associated pneumonia   Palliative care encounter  Discharge Medications: Allergies as of 09/29/2017      Reactions   Zofran Frazier Richards Hcl] Nausea And Vomiting      Medication List    TAKE these medications   ELIQUIS 2.5 MG Tabs tablet Generic drug:  apixaban Take 2.5 mg by mouth 2 (two) times daily.   lisinopril 40 MG tablet Commonly known as:  PRINIVIL,ZESTRIL Take 40 mg by mouth daily.   metoprolol tartrate 25 MG tablet Commonly known as:  LOPRESSOR Take 12.5 mg by mouth 2 (two) times daily.   nystatin 100000 UNIT/ML suspension Commonly known as:  MYCOSTATIN Take 5 mLs (500,000 Units total) by mouth 4 (four) times daily.   timolol 0.5 % ophthalmic solution Commonly known as:  BETIMOL Place 1 drop into both eyes 2 (two) times daily.       Disposition and follow-up:   [name] was discharged from [facility] in Stable condition.  At the hospital follow up visit please address:  1.  Patient has history of dysphagia, dysarthria, and immobility 2/2 multiple CVAs and presented for evaluation of recurrent aspiration pneumonia. Prior to presentation patient treated with IV antibiotics for aspiration pneumonia from 12/25 - 1/1 at outside hospital. Afebrile and  hemodynamically stable throughout admission. Received 5 days IV zosyn for treatment of resolving aspiration pneumonia. Please assess respiratory status and signs/symptoms of infection.  Patient arrived with multiple electrolyte abnormalities and severe protein malnutrition. This appears to be his baseline 2/2 dysphagia and severe aspiration at baseline. Electrolytes repleated on admission, nutrition and palliative care consulted. Patient's dysphagia puts at risk for aspiration and nutritional supplementation difficult at baseline. Patient does not want feeding tube.   During hospitalization patient unable to tolerate sitting in chair for greater than 30 minutes 2/2 pain. Needed to lay flat in bed (in Trendelenburg position on during final session) for dialysis. Patient not candidate for outpatient dialysis. Arrangements were made to facilitate 1-2 more outpatient sessions for comfort, but patient was discharged with plan to transition to hospice after these comfort dialysis sessions. Patient discharged with hospice referral per case management.   2.  Labs / imaging needed at time of follow-up: None  3.  Pending labs/ test needing follow-up: None  Follow-up Appointments: Follow-up Information    [facility] Follow up.   Why:  will contact to arrange initial visit Contact information: [address] [place] [address] [phone]           Hospital Course by problem list: Principal Problem:   Aspiration pneumonia (HCC) Active Problems:   DM type 2 causing ESRD (HCC)   ESRD on dialysis (HCC)   Pressure injury of sacral region, stage 2   Dysphagia due to old stroke  Hypokalemia   Severe protein-calorie malnutrition (HCC)   Hypophosphatemia   Oropharyngeal dysphagia   ESRD (end stage renal disease) (HCC)   Goals of care, counseling/discussion   Advance care planning   Palliative care by specialist   Healthcare-associated pneumonia   Palliative care  encounter   Kenneth Mckenzie 62 yo with PMH of dysphagia and recurrent aspiration 2/2 multiple prior CVAs, ESRD on HD T/Th/Sat, who presented with complaint of shortness of breath. Patient recently admitted and discharged from OSH on 09/19/2017 after 7 days IV zosyn for treatment of aspiration pneumonia. He was admitted to internal medicine teaching service for management. The specific problems addressed during admission were as follows:  Aspiration PNA 2/2 dysphagia from multiple previous CVAs: Patient presented afebrile and hemodynamically stable. On admission lab work he was found to have a leukocytosis to 23.6 and a chest x ray showing improvement in previously observed aspiration pneumonia that was treated at OSH. No no infiltrates observed. Blood cultures were obtained on admission and he was started on IV zosyn. He remained afebrile, hemodynamically stable, and saturating well on room air throughout hospitalization. No significant changes in respiratory status or exam were observed. His blood cultures obtained on admission were no growth at 72 hours during hospitalization.  His leukocytosis down trended to 14.8 from 23.6 on  with IV antibiotics. He was given a total of 5 days IV antibiotic therapy and discharged in stable condition.   Severe protein-calorie malnutrition: Per chart review family has dismissed idea of tube feeds in the patient when the subject was presented in the past. As a result the patient arrived severely cachectic with bilateral muscle wasting apparent in upper and lower extremities. Nutrition was consulted to assess status and recommend tube feeds for severe protein malnutrition. Palliative care was consulted and the family reiterated their want to avoid g-tube in patient. The patient ultimately plans to transition to hospice after leaving hospital, so will continue comfort feeds per patient/family wishes upon discharge.  Hypokalemia and Hypophosphatemia: Patient had several  electrolyte abnormalities present on admission including hypokalemia, hypophosphatemia, hypomagnesemia. Patient received, IV phosphorus, magnesium, and potassium replacement on admission (see labs below). Patient's electrolytes were replenished with IV therapy and during dialysis. His phosphorus levels were within normal limits at time of discharge.   ESRD on HD T,Th,S: Patient received dialysis during hospitalization per his usual schedule. PT/OT worked with patient and observed that he was unable to sit for >30 minutes in a chair without asking to be repositioned 2/2 pain with sitting. Given this, patient is unable sit long enough to tolerate even abbreviated outpatient dialysis sessions and nephrology states he is no longer a candidate for outpatient dialysis. Palliative care was consulted and patient's family prefers for him to not go long term care facility for dialysis. After much discussion patient and family decided to transition to hospice since patient no longer able to tolerate outpatient dialysis. Arrangements were made to have 1-2 more abbreviated dialysis sessions after discharge for comfort, after which patient will transition to full hospice with assistance of [facility].    Stage II Sacral Pressure Ulcers: Patient had stage II pressure ulcers present on admission. No signs or symptoms of infection were noted on admission and throughout the hospitalization. Wound care consulted on admission and their recommendations were followed throughout hospitalization by nursing staff. Eventually patient was transitioned to air bed, which appeared to be more comfortable for the patient. As discussed above, the patient's baseline nutritional status suggests poor prognosis for  wound healing. These chronic wounds contributed to patient's pain while sitting and, therefore, ultimately prevent him from continuing with outpatient dialysis.   Discharge Vitals:   BP 110/68 (BP Location: Right Arm)    Pulse (!) 108   Temp 98.4 F (36.9 C) (Oral)   Resp 15   Ht 6\' 3"  (1.905 m)   Wt 125 lb 10.6 oz (57 kg)   SpO2 100%   BMI 15.71 kg/m   Pertinent Labs, Studies, and Procedures:   BMP Latest Ref Rng & Units 09/28/2017 09/27/2017 09/26/2017  Glucose 65 - 99 mg/dL 161(W137(H) 960(A144(H) 540(J116(H)  BUN 6 - 20 mg/dL 14 9 18   Creatinine 0.61 - 1.24 mg/dL 8.11(B3.73(H) 1.47(W2.61(H) 2.95(A4.34(H)  Sodium 135 - 145 mmol/L 138 137 142  Potassium 3.5 - 5.1 mmol/L 2.9(L) 3.4(L) 3.0(L)  Chloride 101 - 111 mmol/L 99(L) 100(L) 102  CO2 22 - 32 mmol/L 25 22 22   Calcium 8.9 - 10.3 mg/dL 7.7(L) 7.9(L) 8.0(L)   Phosphorus 09/28/2017 = 3.9 (1.3 on admission, 09/22/2017) Magnesium 09/26/2017 = 2.3 (1.7 on admission, 09/22/2017) Lactic acid 09/26/2017 = 1.7 (2.09, 2.94 on admission, 09/22/2017)  CBC Latest Ref Rng & Units 09/28/2017 09/27/2017 09/25/2017  WBC 4.0 - 10.5 K/uL 15.9(H) 16.4(H) 14.8(H)  Hemoglobin 13.0 - 17.0 g/dL 7.1(L) 7.3(L) 8.0(L)  Hematocrit 39.0 - 52.0 % 23.5(L) 24.6(L) 26.2(L)  Platelets 150 - 400 K/uL 335 334 324   Chest X Ray, 09/22/2017: FINDINGS: Left-sided central venous catheter in place, stable. Median sternotomy wires noted. Cardiac and mediastinal silhouettes are stable from previous, and remain within normal limits.  Lungs mildly hypoinflated. Interval improvement in previously seen left basilar airspace disease. Right basilar infiltrates persist, but are also slightly improved. There is a persistent right pleural effusion. No pulmonary edema. No pneumothorax.  No acute osseous abnormality.  IMPRESSION: 1. Persistent but improved bibasilar infiltrates as compared to previous, most prominent on the right. Persistent small right pleural effusion. 2. No other new active cardiopulmonary disease.  Blood Cultures x2, 09/22/2017: Culture, blood (Routine x 2) [[id number]] Collected: 09/22/17 1208  Specimen: BLOOD LEFT HAND Updated: 09/27/17 1312   Specimen Description BLOOD LEFT HAND   Special Requests IN  PEDIATRIC BOTTLE Blood Culture adequate volume   Culture NO GROWTH 5 DAYS   Report Status 09/27/2017 FINAL   Discharge Instructions: Discharge Instructions    Diet - low sodium heart healthy   Complete by:  As directed    Discharge instructions   Complete by:  As directed    You were treated with IV antibiotics for 5 days for an aspiration pneumonia. You tolerated this IV antibiotic therapy well and were discharged without any clinical signs or symptoms of infection.   Increase activity slowly   Complete by:  As directed       Signed: [name], Jairen Goldfarb, MD 09/29/2017, 11:13 AM   Pager: [phone]

## 2017-09-26 NOTE — Progress Notes (Signed)
I examined Mr. Kenneth Mckenzie at bedside, spoke to the physical therapists, the attending team and nephrology. Then I called his wife.   Nephrology suggested that Mr. Kenneth Mckenzie sit up in the chair for 4 hours to see if he is still a candidate for outpatient HD.    I talked with his wife on the phone.  We determined that [facility] and that if she needed a nephrologist to write an order for transportation via non emergent ambulance to HD - then that would come from his primary outpatient nephrologist.  I validated her decision for DNR.  She feels good about making that change.   However she was confident that he would be able to sit for HD.     We hung up and then I spoke with PT again.  Their evaluation after having him in the chair for 20 minutes is that he became uncomfortable and was unable to reposition himself what so ever.  They felt he had a high likelihood of quickly developing a decubitus ulceration if he is made to sit in a chair for 4 hours for HD.  He was uncomfortable after 15 min.  PE:  Frail chronically ill appearing male, cachectically thin.  Awake, Alert, speech is difficult to understand.   Resp:  On nasal canula - breath sounds are coarse but NAD Extremities:  Hands are contracted.  No edema.  Assessment:  62 yo male with recurrent aspiration pneumonia and severe malnutrition.  CVA in 2013.  On-going ESRD patient on HD.  Recommendation:   Need to discuss physical therapy findings with wife.  She will need to decide if she wants to take him home with the knowledge that he will be unlikely to tolerate HD.   If HD is not continued [facility].  Kenneth RichardsMarianne Maayan Jenning, PA-C Palliative Medicine Pager: [phone]

## 2017-09-26 NOTE — Care Management Note (Signed)
Case Management Note  Patient Details  Name: Kenneth Mckenzie MRN: [id number] Date of Birth: 02-17-1956  Subjective/Objective:     Pt admitted with SOB              Action/Plan:  PTA from home with wife.  Per discussion 09/25/17; Wife would like for pt to return home if its deemed appropriate - PT eval pending.  [facility] - CM contacted agency and informed of tentative referral (orders are not yet written).  Pt will also need Palliative in the Community consult if pt goes home per [facility] Palliative Note - CM department will continue to follow for discharge needs   Expected Discharge Date:                  Expected Discharge Plan:  Home w Home Health Services  In-House Referral:  Clinical Social Work  Discharge planning Services  CM Consult  Post Acute Care Choice:  Home Health Choice offered to:  Spouse  DME Arranged:  N/A DME Agency:  NA  HH Arranged:  RN, PT, Nurse's Aide, Social Work, Corporate investment bankerpeech Therapy HH Agency[facility]  [facility]  Status of Service:  In process, will continue to follow  If discussed at Long Length of Stay Meetings, dates discussed:    Additional Comments:  Cherylann ParrClaxton, Ishitha Roper S, RN 09/26/2017, 11:46 AM

## 2017-09-27 DIAGNOSIS — Z515 Encounter for palliative care: Secondary | ICD-10-CM

## 2017-09-27 DIAGNOSIS — D649 Anemia, unspecified: Secondary | ICD-10-CM

## 2017-09-27 LAB — BASIC METABOLIC PANEL
Anion gap: 15 (ref 5–15)
BUN: 9 mg/dL (ref 6–20)
CHLORIDE: 100 mmol/L — AB (ref 101–111)
CO2: 22 mmol/L (ref 22–32)
Calcium: 7.9 mg/dL — ABNORMAL LOW (ref 8.9–10.3)
Creatinine, Ser: 2.61 mg/dL — ABNORMAL HIGH (ref 0.61–1.24)
GFR calc non Af Amer: 25 mL/min — ABNORMAL LOW (ref >60)
GFR, EST AFRICAN AMERICAN: 29 mL/min — AB (ref >60)
Glucose, Bld: 144 mg/dL — ABNORMAL HIGH (ref 65–99)
Potassium: 3.4 mmol/L — ABNORMAL LOW (ref 3.5–5.1)
Sodium: 137 mmol/L (ref 135–145)

## 2017-09-27 LAB — PHOSPHORUS: Phosphorus: 2.2 mg/dL — ABNORMAL LOW (ref 2.5–4.6)

## 2017-09-27 LAB — CBC
HCT: 24.6 % — ABNORMAL LOW (ref 39.0–52.0)
HEMOGLOBIN: 7.3 g/dL — AB (ref 13.0–17.0)
MCH: 26.9 pg (ref 26.0–34.0)
MCHC: 29.7 g/dL — ABNORMAL LOW (ref 30.0–36.0)
MCV: 90.8 fL (ref 78.0–100.0)
Platelets: 334 10*3/uL (ref 150–400)
RBC: 2.71 MIL/uL — AB (ref 4.22–5.81)
RDW: 16.4 % — ABNORMAL HIGH (ref 11.5–15.5)
WBC: 16.4 10*3/uL — AB (ref 4.0–10.5)

## 2017-09-27 LAB — CULTURE, BLOOD (ROUTINE X 2)
CULTURE: NO GROWTH
Culture: NO GROWTH
SPECIAL REQUESTS: ADEQUATE
SPECIAL REQUESTS: ADEQUATE

## 2017-09-27 MED ORDER — POTASSIUM PHOSPHATES 15 MMOLE/5ML IV SOLN
20.0000 meq | Freq: Once | INTRAVENOUS | Status: AC
  Administered 2017-09-27: 20 meq via INTRAVENOUS
  Filled 2017-09-27: qty 4.55

## 2017-09-27 MED ORDER — NYSTATIN 100000 UNIT/ML MT SUSP
5.0000 mL | Freq: Four times a day (QID) | OROMUCOSAL | Status: DC
  Administered 2017-09-27 – 2017-09-29 (×7): 500000 [IU] via ORAL
  Filled 2017-09-27 (×8): qty 5

## 2017-09-27 NOTE — Progress Notes (Signed)
  [facility] Progress Note   Subjective:  Pt w/o complaints.  Per pall care they are in conversation w/ family about pt's poor condition and discussing possible transition to hospice after another 1-2 HD sessions  Objective Vitals:   09/26/17 2106 09/26/17 2158 09/27/17 0425 09/27/17 0948  BP:  (!) 111/58 (!) 97/51 (!) 92/55  Pulse: 88 99 98 86  Resp: 18 16 16 18   Temp:  99 F (37.2 C) 98.9 F (37.2 C) 98.8 F (37.1 C)  TempSrc:  Axillary Oral Axillary  SpO2: (!) 88% 92% 99% 98%  Weight:      Height:       Physical Exam General: Frail man, NAD. On nasal oxygen. Contractures in UE. Heart: RRR; no murmur Lungs: CTA anteriorly Extremities: No LE edema Dialysis Access: TDC in L chest; no erythema  Additional Objective Labs: Basic Metabolic Panel: Recent Labs  Lab 09/25/17 1047 09/26/17 0524 09/27/17 0613  NA 138 142 137  K 3.6 3.0* 3.4*  CL 101 102 100*  CO2 24 22 22   GLUCOSE 94 116* 144*  BUN 13 18 9   CREATININE 3.45* 4.34* 2.61*  CALCIUM 7.9* 8.0* 7.9*  PHOS 3.3 3.4 2.2*   Liver Function Tests: Recent Labs  Lab 09/22/17 0513 09/23/17 0440 09/23/17 0654  AST 23  --   --   ALT 10*  --   --   ALKPHOS 129*  --   --   BILITOT 1.6*  --   --   PROT 7.8  --   --   ALBUMIN 1.7* 1.5* 1.5*   CBC: Recent Labs  Lab 09/22/17 0513 09/23/17 0440 09/23/17 0654 09/25/17 0647 09/27/17 0613  WBC 23.6* 18.3* 19.4* 14.8* 16.4*  NEUTROABS 20.6*  --   --  12.5*  --   HGB 9.3* 8.5* 7.9* 8.0* 7.3*  HCT 30.4* 28.3* 26.0* 26.2* 24.6*  MCV 91.8 89.6 90.0 89.7 90.8  PLT 367 334 328 324 334   Medications: . sodium chloride     . apixaban  2.5 mg Oral BID  . doxercalciferol  1 mcg Intravenous Q T,Th,Sa-HD  . mouth rinse  15 mL Mouth Rinse BID  . nystatin  5 mL Oral QID  . sodium chloride flush  3 mL Intravenous Q12H  . sodium chloride flush  3 mL Intravenous Q12H  . timolol  1 drop Both Eyes BID    Dialysis Orders: TTS @ [facility] 4hr, TDC, EDW 60.5kg  (but left at 58.6kg last HD), 3K/2.25, heparin 4000 bolus - Hectorol 1mcg IV q HD - Mircera 50 q2weeks (last given 09/21/17)  Assessment: 1. Dyspnea/Aspiration PNA - On Zosyn per primary. [facility].  2. HxDysphasia, dysarthriawith previous CVAs 3. ESRD - patient is not a candidate for outpatient dialysis in this condition. Will continue to provide inpatient HD while EOL discussions are ongoing between pall care and family.  4. Anemia - Hgb trending down, ESA recently dosed 1/3. 5. MBD- Phos low,Improving with supplement, continue Hectorol.  6. HTN/volume - BP stable. No volume overload on exam. 4kg below EDW by weights here  7. Severe PCM -Nutritional supplements per RD. 8. Sacral pressure ulcers - WOC following 9. Dispo: pall care working w/ pt and family, poor prognosis, possible transition to hospice soon.     Vinson Moselleob Minela [place] MD [facility] pgr [phone]   09/27/2017, 11:50 AM

## 2017-09-27 NOTE — Progress Notes (Signed)
Attempted to get patient up to chair, after approximately 15 min patient requested to get back in bed. Patient is in bed, resting comfortably.

## 2017-09-27 NOTE — Progress Notes (Signed)
Nutrition Follow-up  DOCUMENTATION CODES:   Severe malnutrition in context of chronic illness, Underweight  INTERVENTION:  - Will order Magic Cup TID with meals, each supplement provides 290 kcal and 9 grams of protein - Continue to encourage PO intakes. - Will monitor for ongoing POC/GOC.  NUTRITION DIAGNOSIS:   Severe Malnutrition related to acute illness, dysphagia as evidenced by severe muscle depletion, severe fat depletion. -ongoing  GOAL:   Patient will meet greater than or equal to 90% of their needs -unmet  MONITOR:   PO intake, Supplement acceptance, Weight trends, Labs, Skin  ASSESSMENT:   62 y/o male Pmhx HTN, ESRD on HD, Multiple CVAs w/ resultant dysarthria and dysphagia w/ recurrent/frequent aspiration events. Pt has had several recent hospitalizations for sepsis r/t PNA. Brought to ED by wife due to concerns of abnormal breathing. Worked up for elevated WBC and persistent but improved bibasilar infiltrates. Admitted for management of suspected PNA.  1/9 Pt being followed by Palliative Care and is now DNR. Last seen by Palliative yesterday afternoon and noted that if HD is not going to be continued then pt would be appropriate for residential hospice. Follow-up done by SLP yesterday with recommendation for current diet: Dysphagia 1, pudding thick liquids; oral nutrition supplement of Magic Cup "melts" to pudding-thick consistency. Pt has been eating 10-25% of meals since previous assessment and has been unable to meet nutrition needs. Weight has been stable throughout admission.  Medications reviewed. Labs reviewed; K: 3.4 mmol/L, Cl: 100 mmol/L, creatinine: 2.61 mg/dL, Ca: 7.9 mg/dL, Phos: 2.2 mg/dL, GFR: 25 mL/min.       1/5 - Saw pt in dialysis. - Largely nonverbal. The only thing he said was "(something) my legs" , which he repeated a few times.  - Shook his head no when asked about any appetite.  - Also denies any n/v/c/d. Nodded yes when RD said he would  order appropriate supplements when diet is advanced.  - Per Care Everywhere, he was ~141 lbs [facility] and 130.6 lbs [facility]  - Bed weight during HD today was 57.9 kg, indicating further wt loss.  - Family apparently has refused feeding tube for nutrition.  - As such, pt is relegated to attempt to maintain his nutrition orally, which has repeatedly been shown to be unsafe and ineffective.  - Unsure of efficacy of PEG would even offer as unclear how much of his aspiration is related to oral intake as opposed to his own secretions. - ST has been consulted to evaluate.  - If tube feeding is pursued, he would certainly be very high risk for refeeding syndrome, already with low K/Phos - Palliative Care has been consulted to help w/ GOC. - Physical Exam: Profoundly cachectic. Severe lower body muscle wasting, contracted. Severe upper body muscle/fat wasting w/ LUE contracted.      Diet Order:  DIET - DYS 1 Room service appropriate? Yes; Fluid consistency: Pudding Thick  EDUCATION NEEDS:   No education needs have been identified at this time  Skin:  Skin Assessment: Skin Integrity Issues: Skin Integrity Issues:: DTI DTI: L buttocks  Last BM:  1/9  Height:   Ht Readings from Last 1 Encounters:  09/22/17 6\' 3"  (1.905 m)    Weight:   Wt Readings from Last 1 Encounters:  09/26/17 125 lb 0 oz (56.7 kg)    Ideal Body Weight:  80.19 kg(Adjusted for bedbound state)  BMI:  Body mass index  is 15.62 kg/m.  Estimated Nutritional Needs:   Kcal:  2000-2200 kcals (35-38 kcal/kg bw)  Protein:  87-105g Pro (1.5-1.8 g/kg bw)  Fluid:  Per md      Trenton Gammon, MS, RD, LDN, CNSC Inpatient Clinical Dietitian Pager # [phone] After hours/weekend pager # [phone]

## 2017-09-27 NOTE — Progress Notes (Signed)
Medicine attending: I examined this patient today together with resident physician Dr. Rozann LeschesMarybeth Nedrud and I concur with her evaluation and management plan which we discussed together. We greatly appreciate ongoing input from nephrology and palliative care services. No acute change in the patient's medical condition.  He remains severely deconditioned.  Another attempt to get him out of bed and in a chair but he was only able to withstand 15 minutes before he asked to be put back in the bed.  This excludes him from an outpatient dialysis center which requires that he be able to sit in a chair for about 4 hours. Discharge to home was tentatively planned for today.  This was canceled when we discussed the patient's status with the nephrologist who recommended giving the patient a trial in a chair in the dialysis unit tomorrow to assess his tolerance. When Dr. Saunders RevelNedrud tried to explain the situation to the patient's wife she became angry and accused Dr. Saunders RevelNedrud of not being empathetic to his situation which is not the case. Palliative care made phone contact and then had a face-to-face meeting with the patient wife and daughter this afternoon.  Family did agree with a tapering dialysis schedule with subsequent discharge to home on January 11 then ultimately discontinue dialysis and [facility].

## 2017-09-27 NOTE — Progress Notes (Signed)
Daily Progress Note   Patient Name: Kenneth Mckenzie       Date: 09/27/2017 DOB: April 27, 1956  Age: 62 y.o. MRN#: [id number] Attending Physician: Annia Belt, MD Primary Care Physician: Patient, No Pcp Per Admit Date: 09/22/2017  Reason for Consultation/Follow-up: Establishing goals of care  Subjective: Talked at bedside with the patient and on the phone with wife.  Discussed patient with Nephrology and Family Medicine MD.  Then met at bedside later in the day with patient, wife, and daughter Loma Sender).  After much discussion, thought and consideration we developed the following plan as our best attempt to honor Kenneth Mckenzie wishes.    1/10 he will have dialysis for 2.5 hours in the hospital.  1/11 discharge home.  Dr. Jonnie Finner will talk with the outpatient HD center to attempt to arrange another 1 or 2 abbreviated sessions as a transition to hospice.             1/16 the patient will finish HD and start Hospice at home with [facility].  Note:  If the patient decides earlier that he does not want to get dressed and go to HD - Hospice will be asked to start earlier.   Assessment:  62 yo male, bedbound, unable to functionally used his arms or legs, multiple wounds in his sacral area, no longer eating or drinking, ESRD on HD.  Only able to tolerate limited HD  Patient Profile: 62 y.o.malewith past medical history of ESRD on dialysis, DM, bipolar d/o, CVA's w/ resulting dysphagia and dysarthria, ischemic limb, hx recent admissions to different facility for likely aspiration pneumonia,admitted on1/4/2019with SOB.Workup reveals likely recurrent aspiration pneumonia, malnutrition. Palliative medicine consulted for [name].   Length of Stay: 5  Current Medications: Scheduled  Meds:  . apixaban  2.5 mg Oral BID  . doxercalciferol  1 mcg Intravenous Q T,Th,Sa-HD  . mouth rinse  15 mL Mouth Rinse BID  . sodium chloride flush  3 mL Intravenous Q12H  . sodium chloride flush  3 mL Intravenous Q12H  . timolol  1 drop Both Eyes BID    Continuous Infusions: . sodium chloride      PRN Meds: sodium chloride, heparin, RESOURCE THICKENUP CLEAR, sodium chloride flush  Physical Exam       Tall thin cachectic male, lethargic, able to  nod yes or no. CV rrr w/ murmur Resp no distress Abdomen soft, thin, nt   Vital Signs: BP (!) 92/55 (BP Location: Left Arm)   Pulse 86   Temp 98.8 F (37.1 C) (Axillary)   Resp 18   Ht 6' 3"  (1.905 m)   Wt 56.7 kg (125 lb 0 oz)   SpO2 98%   BMI 15.62 kg/m  SpO2: SpO2: 98 % O2 Device: O2 Device: Nasal Cannula O2 Flow Rate: O2 Flow Rate (L/min): 1 L/min(increased to 2 L)  Intake/output summary:   Intake/Output Summary (Last 24 hours) at 09/27/2017 1115 Last data filed at 09/27/2017 0800 Gross per 24 hour  Intake 6 ml  Output 0 ml  Net 6 ml   LBM: Last BM Date: 09/25/17 Baseline Weight: Weight: 59 kg (130 lb 1.1 oz) Most recent weight: Weight: 56.7 kg (125 lb 0 oz)       Palliative Assessment/Data: 20%    Flowsheet Rows     Most Recent Value  Intake Tab  Referral Department  Hospitalist  Unit at Time of Referral  ER  Palliative Care Primary Diagnosis  Nephrology  Date Notified  09/22/17  Palliative Care Type  New Palliative care  Reason for referral  Clarify Goals of Care  Date of Admission  09/22/17  Date first seen by Palliative Care  09/24/17  # of days Palliative referral response time  2 Day(s)  # of days IP prior to Palliative referral  0  Clinical Assessment  Psychosocial & Spiritual Assessment  Palliative Care Outcomes      Patient Active Problem List   Diagnosis Date Noted  . Healthcare-associated pneumonia   . Oropharyngeal dysphagia 09/24/2017  . ESRD (end stage renal disease) ([place])   . Goals  of care, counseling/discussion   . Advance care planning   . Palliative care by specialist   . Aspiration pneumonia ([place]) 09/22/2017  . Pressure injury of sacral region, stage 2 09/22/2017  . 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 Hypokalemia 09/22/2017  . Severe protein-calorie malnutrition ([place]) 09/22/2017  . Hypophosphatemia 09/22/2017  . Acute upper GI bleed 05/29/2016  . Gastritis and gastroduodenitis 05/29/2016  . Aphasia complicating stroke 37/06/6268  . ESRD on dialysis ([place]) 05/29/2016  . Hyperlipidemia associated with type 2 diabetes mellitus ([place]) 05/29/2016  . Glaucoma due to secondary diabetes ([place]) 05/29/2016  . DM type 2 causing ESRD ([place])   . Hypertension associated with stage 5 chronic kidney disease due to type 2 diabetes mellitus ([place])   . Renal disease   . Coronary heart disease   . CVA (cerebrovascular accident) [facility])     Palliative Care Plan    Recommendations/Plan:  1/10 he will have dialysis for 2.5 hours in the hospital.    Case Management order has been placed to engage [place] with the [place] family.  (Referral made).  1/11 discharge home.  Dr. Jonnie Finner will talk with the outpatient HD center to attempt to arrange another 1 or 2 abbreviated sessions as a transition to hospice.  1/16 the patient will finish HD and start Hospice at home with [facility].  Note:  If the patient decides earlier that he does not want to get dressed and go to HD - Hospice will be asked to start earlier.   Code Status:  DNR  Prognosis:   < 2 weeks no longer eating, drinking.  Plans to have final HD session on 1/16 (or sooner).   Discharge Planning:  Home with plan to finish HD and start Hospice.  Care plan was discussed with family, RN, Nephrology  Thank you for allowing the Palliative Medicine Team to assist in the care of this patient.  Total time spent:  75 min. In 10:30am Out 11:00 am In 3:30 pm Out 4:15 pm     Greater  than 50%  of this time was spent counseling and coordinating care related to the above assessment and plan.  Florentina Jenny, PA-C Palliative Medicine  Please contact Palliative MedicineTeam phone at [phone] for questions and concerns between 7 am - 7 pm.   Please see AMION for individual provider pager numbers.

## 2017-09-27 NOTE — Progress Notes (Signed)
Subjective:  Patient seen laying comfortably in bed this AM. Nods saying he feels well this am. Shakes his head indicating that he is not in current pain or discomfort.  Objective:  Vital signs in last 24 hours: Vitals:   09/26/17 2106 09/26/17 2158 09/27/17 0425 09/27/17 0948  BP:  (!) 111/58 (!) 97/51 (!) 92/55  Pulse: 88 99 98 86  Resp: 18 16 16 18   Temp:  99 F (37.2 C) 98.9 F (37.2 C) 98.8 F (37.1 C)  TempSrc:  Axillary Oral Axillary  SpO2: (!) 88% 92% 99% 98%  Weight:      Height:       Physical Exam  Constitutional:  Severely cachetic appearing man laying in bed in no acute distress with pillows and protective boots in place to prevent bed sores.  HENT:  Mouth/Throat: Oropharynx is clear and moist.  Cardiovascular: Normal rate and regular rhythm. Exam reveals no friction rub.  No murmur heard. Respiratory:  Poor effort. No crackles or wheezing appreciated in anterior lung fields. No rhonchi appreciated.  Musculoskeletal: He exhibits deformity (Patient's bilateral upper extremities contracted at baseline, unchanged from previous exams). He exhibits no edema (of bilateral lower extremities) or tenderness (of bilateral lower extremities).  Neurological:  Awake, alert. Cooperates with examiners but is unable to spontaneously move all 4 extremities during exam, unchanged from previous exams.  Skin: Skin is warm and dry. No rash noted. No erythema.  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 Patient recently admitted and discharged from OSH on 09/19/2017 after 7 days IV zosyn for treatment of aspiration pneumonia. He was admitted to internal medicine teaching service for management. The specific problems addressed during admission were as follows:  Aspiration PNA 2/2 Dysphagia from multiple previous CVAs: Patient completed 5 day course of IV zosyn for treatment of possible aspiration pneumonia. Patient remains afebrile and hemodynamically stable. Patient's WBC= 16.4 today, overall down from admission where WBC 23.6 on admission. Patient has not clinically changed with antibiotic treatment.  -IV zosyn, day 5/5 today, no further antibiotics required -Dysphagia 1 diet -Blood cultures no growth at 4 days  Severe protein-calorie malnutrition: Per chart review family has dismissed idea of tube feeds in patient, patient's wife reiterated this point on phone today. -Aggressive protein and nutritional supplementations per nutrition recommendations -Palliative care will follow as outpatient  Hypokalemia and Hypophosphatemia: Patient received IV phosphorus, magnesium, and potassium replacement on admission. Current K = 3.4, Phos = 2.2, Mg= 2.3.   -Phosphorus repletion today -Follow up daily phos, BMP   ESRD on HD T,Th,S: Spoke to patient's wife on phone to inform of nephrology's plan to complete dialysis in chair tomorrow, therefore discharge will be delayed one more day. Wife understandably upset about testing done yesterday that suggests patient may not tolerate sitting in chair for outpatient dialysis. Repeatedly stated  that writer was mistaken regarding patient's clinical status since I haven't clinically evaluated him before today(?). She states that he couldn't tolerate sitting in chair because  chair not big enough and not enough cushoning for current bedsores. Patient's wife very angry with Clinical research associatewriter regarding current plan of care. Provided information and clinical reasoning that led to yesterday's PT/OT evaluation and plan to attempt dialysis sitting in chair tomorrow. Patient's wife angry that we think patient needs to sit in chair for 4 hours for outpatient dialysis, stating that his sessions never last that long anyway and we are setting unrealistic goals for patient. I attempted to provide support and answer all questions regarding current plan of care.  -Nephrology consulted and appreciated  Chronic Normocytic Anemia: Patient's Hgb 7.3 today, down from admission. He has been chronically between around 8 +/- 1. No overt signs of bleeding. Will continue to monitor and transfuse as needed for Hgb <7. Suspect likely to poor nutrition, ESRD.  PAD: Patient found to have arterial thrombus in Right LE during prior hospitalization on 12/12 and started on warfarin. Patient subtherapeutic when represented to hospital for evaluation on 12/25, so transitioned to Eliquis.  -Continue home Eliquis 2.5 mg BID  Stage II Sacral Pressure Ulcers: Present on admission. No signs or symptoms of infection. Nutritional status and deficits suggest poor prognosis for wound healing. Will continue efforts to promote healing and prevent infection. This wound likely contributes to patient's discomfort regarding sitting in chair during dialysis. -Wound care consulted. Appreciate assistance from nursing staff  FEN/GI: -Dysphagia 1 per speech recommendations -No IVF, replacing electrolytes as needed (see above)  VTE Prophylaxis: Eliquis 2.5 mg daily Code Status: DNR, per palliative care discussion yesterday  Dispo: Anticipated discharge 0-1 days home with outpatient palliative care.  Kenneth Mckenzie, Kenneth Jupin, MD 09/27/2017, 10:43 AM Pager: [phone]

## 2017-09-27 NOTE — Progress Notes (Signed)
CPT held at this time. Pt is sleeping comfortably. No distress or complications noted

## 2017-09-27 NOTE — Progress Notes (Addendum)
NCM spoke with pt's wife and offered choice for Home Hospice vs Hospice Home. She was agreeable to [facility]. Contacted [facility] with new referral. Spoke to [place]. Will need [facility] for transportation to home. Isidoro DonningAlesia Issam Carlyon RN CCM Case Mgmt phone [phone]

## 2017-09-27 NOTE — Consult Note (Signed)
WOC Nurse wound consult note Reason for Consult:advancing partial thickness wounds on buttocks Wound type:MASD IAD combined with pressure Pressure Injury POA: Yes Measurement: entire area measures 10cm x 10cm and has multiple 1cm round x 0.1cm partial thickness skin loss. Wound bed: pink Drainage (amount, consistency, odor) scant  Periwound: MASD Dressing procedure/placement/frequency: I have provided nurses with orders for Low air loss mattress replacement as well as using only Derma Therapy next to skin.  If no Derma Therapy pads available cover pad with pillow case or top sheet folded. Cleanse with peri products, apply foam dressing. (using two clover shape foam dressings may work better due to proximity to anus and frequent incontinence of stools.) Aware of poor nutritional state, poor intake, palliative team still waiting to meet with family for goals of care. We will not follow, but will remain available to this patient, to nursing, and the medical and/or surgical teams.  Please re-consult if we need to assist further.   Barnett HatterMelinda Anquan Azzarello, RN-C, WTA-C Wound Treatment Associate

## 2017-09-28 LAB — CBC
HCT: 23.5 % — ABNORMAL LOW (ref 39.0–52.0)
Hemoglobin: 7.1 g/dL — ABNORMAL LOW (ref 13.0–17.0)
MCH: 27.4 pg (ref 26.0–34.0)
MCHC: 30.2 g/dL (ref 30.0–36.0)
MCV: 90.7 fL (ref 78.0–100.0)
Platelets: 335 10*3/uL (ref 150–400)
RBC: 2.59 MIL/uL — ABNORMAL LOW (ref 4.22–5.81)
RDW: 16.5 % — AB (ref 11.5–15.5)
WBC: 15.9 10*3/uL — ABNORMAL HIGH (ref 4.0–10.5)

## 2017-09-28 LAB — BASIC METABOLIC PANEL
Anion gap: 14 (ref 5–15)
BUN: 14 mg/dL (ref 6–20)
CALCIUM: 7.7 mg/dL — AB (ref 8.9–10.3)
CO2: 25 mmol/L (ref 22–32)
CREATININE: 3.73 mg/dL — AB (ref 0.61–1.24)
Chloride: 99 mmol/L — ABNORMAL LOW (ref 101–111)
GFR calc Af Amer: 19 mL/min — ABNORMAL LOW (ref >60)
GFR, EST NON AFRICAN AMERICAN: 16 mL/min — AB (ref >60)
GLUCOSE: 137 mg/dL — AB (ref 65–99)
Potassium: 2.9 mmol/L — ABNORMAL LOW (ref 3.5–5.1)
SODIUM: 138 mmol/L (ref 135–145)

## 2017-09-28 LAB — PHOSPHORUS: Phosphorus: 3.9 mg/dL (ref 2.5–4.6)

## 2017-09-28 MED ORDER — LIDOCAINE HCL (PF) 1 % IJ SOLN: 5.0000 mL | INTRAMUSCULAR | Status: DC | PRN

## 2017-09-28 MED ORDER — PENTAFLUOROPROP-TETRAFLUOROETH EX AERO: 1.0000 "application " | INHALATION_SPRAY | CUTANEOUS | Status: DC | PRN

## 2017-09-28 MED ORDER — LIDOCAINE-PRILOCAINE 2.5-2.5 % EX CREA: 1.0000 "application " | TOPICAL_CREAM | CUTANEOUS | Status: DC | PRN

## 2017-09-28 MED ORDER — HEPARIN SODIUM (PORCINE) 1000 UNIT/ML DIALYSIS: 3000.0000 [IU] | Freq: Once | INTRAMUSCULAR | Status: DC

## 2017-09-28 MED ORDER — SODIUM CHLORIDE 0.9 % IV SOLN: 100.0000 mL | INTRAVENOUS | Status: DC | PRN

## 2017-09-28 MED ORDER — SODIUM CHLORIDE 0.9 % IV SOLN
100.0000 mL | INTRAVENOUS | Status: DC | PRN
Start: 2017-09-28 — End: 2017-09-28

## 2017-09-28 MED ORDER — ALTEPLASE 2 MG IJ SOLR: 2.0000 mg | Freq: Once | INTRAMUSCULAR | Status: DC | PRN

## 2017-09-28 MED ORDER — HEPARIN SODIUM (PORCINE) 1000 UNIT/ML DIALYSIS: 1000.0000 [IU] | INTRAMUSCULAR | Status: DC | PRN

## 2017-09-28 NOTE — Progress Notes (Signed)
Subjective:  Patient seen laying comfortably in air bed, in Trendelenburg position during dialysis. Arouses to voice. Nods that he doesn't have any current complaints. Not as interactive with examiner today.   Objective:  Vital signs in last 24 hours: Vitals:   09/28/17 0630 09/28/17 0650 09/28/17 0654 09/28/17 0700  BP: (!) 90/51 (!) 90/53 (!) 95/58 (!) 95/58  Pulse: 88 86 83 82  Resp: 16 15 16 16   Temp: 98.3 F (36.8 C) 98.2 F (36.8 C)    TempSrc: Oral Oral    SpO2: 100% 99%    Weight:  130 lb (59 kg)    Height:       Physical Exam  Constitutional:  Severely cachetic appearing man laying in bed in no acute distress,  HENT:  Mouth/Throat: Oropharynx is clear and moist.  Cardiovascular: Normal rate and regular rhythm. Exam reveals no friction rub.  No murmur heard. Respiratory:  Poor effort. No crackles or wheezing appreciated in anterior lung fields. No rhonchi appreciated.  Musculoskeletal: He exhibits deformity (Patient's bilateral upper extremities contracted at baseline, unchanged from previous exams). He exhibits no edema (of bilateral lower extremities) or tenderness (of bilateral lower extremities).  Severe muscle wasting throughout, unchanged from previous exams  Neurological:  Awake, alert. Unable to spontaneously move all 4 extremities during exam, unchanged from previous exams.  Skin: Skin is warm and dry. No rash noted. No erythema.  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 Patient recently admitted and discharged from OSH on 09/19/2017 after 7 days IV zosyn for treatment of aspiration pneumonia. He was admitted to internal medicine teaching service for management. The specific problems addressed during admission were as follows:  Aspiration PNA 2/2 Dysphagia from multiple previous CVAs: Patient completed 5 day course of IV zosyn for treatment of possible aspiration pneumonia. Patient remains afebrile and hemodynamically stable.  -IV zosyn, 5 days completed course -WBC 15.9, down from 23 on admission -Dysphagia 1 diet -Blood cultures no growth at 5 days (FINAL)  Severe protein-calorie malnutrition: Per chart review family has dismissed idea of tube feeds in patient. Patient transitioning towards comfort care  Hypokalemia and Hypophosphatemia: Patient received IV phosphorus, magnesium, and potassium replacement on admission.  -Phos = 3.9, K = 2.9 (before dialysis)  ESRD on HD T,Th,S: Not candidate for outpatient dialysis. Plan for abbreviated session today, that was tolerated well by the patient. 1-2 outpatient sessions after discharge on 1/11 and transfer to hospice. -Nephrology consulted and appreciated -Case management consulted and appreciated  Chronic Normocytic Anemia: Patient's Hgb 7.1 today, down from admission but stable from yesterday. He has been chronically between around 8 +/- 1. No overt signs of bleeding.   PAD: Patient found to have arterial thrombus in Right LE during prior hospitalization on 12/12 transitioned to Eliquis after  subtherapeutic on warfarin. -Continue home Eliquis 2.5 mg BID  Stage II Sacral Pressure Ulcers: Present on admission. No signs or symptoms of infection.  -Wound care consulted, recommendations  carried out by nursing staff who are doing an excellent job  FEN/GI: -Dysphagia 1 per speech recommendations -No IVF, replacing electrolytes as needed (see above)  VTE Prophylaxis: Eliquis 2.5 mg daily Code Status: DNR  Dispo: Anticipated discharge 0-1 days home with comfort dialysis x1-2 sessions if tolerated and hospice.   Rozann Lesches, MD 09/28/2017, 7:14 AM Pager: [phone]

## 2017-09-28 NOTE — Progress Notes (Signed)
Medicine attending: I examined this patient today together with resident physician Dr. Rozann LeschesMarybeth Nedrud and I concur with her evaluation and management plan which we discussed together. Patient remains very depressed.  Not verbally communicative.  Does shake his head yes or no to questions. Coarse rhonchi over the anterior chest.  Regular cardiac rhythm.  No edema.  Significant muscle wasting. Tentative plan for discharge tomorrow.  Tapering dialysis schedule.  Transition to hospice care.

## 2017-09-28 NOTE — Progress Notes (Signed)
1600 CPT held at this time. Patient is sleeping comfortably & isn't in any distress.

## 2017-09-28 NOTE — Procedures (Signed)
Seen on HD, resting comfortably. 500mL UF today, BFR 350. Plan is still to transition to hospice over the next week.  TTS @ [facility] 4hr, TDC, EDW 60.5kg (but left at 58.6kg last HD),3K/2.25, heparin 4000 bolus -Hectorol 1mcg IVq HD -Mircera 50 q2weeks (last given 09/21/17)  Assessment: 1. Dyspnea/Aspiration PNA- On Zosynper primary. [facility] with PNA.  2. HxDysphasia, dysarthriawith previous CVAs 3. ESRD - Per discussions, HD today, then discharge. HD as outpt on Sat and Tues, then to hospice and no further HD. 4. Anemia - Hgb trending down, ESA recently dosed 1/3. 5. MBD- Phos low,Improving with supplement, continue Hectorol.  6. HTN/volume - BP low. No volume overload on exam. 4kg below EDW by weights here  7. Severe PCM -Nutritional supplements per RD. 8. Sacral pressure ulcers - WOC following  Ozzie HoyleKatie Stovall, PA-C [facility] Pager [phone]  Pt seen, examined and agree w A/P as above.  Vinson Moselleob Yitzchok Carriger MD [facility] pager [phone]   09/28/2017, 9:20 AM

## 2017-09-29 DIAGNOSIS — F322 Major depressive disorder, single episode, severe without psychotic features: Secondary | ICD-10-CM

## 2017-09-29 DIAGNOSIS — D631 Anemia in chronic kidney disease: Secondary | ICD-10-CM

## 2017-09-29 DIAGNOSIS — Z681 Body mass index (BMI) 19 or less, adult: Secondary | ICD-10-CM

## 2017-09-29 MED ORDER — NYSTATIN 100000 UNIT/ML MT SUSP: 5.0000 mL | Freq: Four times a day (QID) | OROMUCOSAL | 0 refills | Status: AC

## 2017-09-29 NOTE — Progress Notes (Signed)
I called patient's wife and went over patient's discharge instructions. She gave me verbal consent that she agreed to the discharge summary. Patient is unavailable to sign and caregiver/wife was at home. [facility] has been notified to transport patient to home with hospice care.

## 2017-09-29 NOTE — Care Management Note (Signed)
Case Management Note  Patient Details  Name: Kenneth EdelsonMichael A Mckenzie MRN: [id number] Date of Birth: Mar 20, 1956  Subjective/Objective:                 Anticipate DC to home with [facility] care today. [facility] [phone] who verified that equipment, bed, BSC, WC, Walker, O2 for comfort as needed, Shower seat in place at the house. She states they have been in contact with spouse yesterday and today setting up DC plan and she knows to call them when patient arrives at home.  Spoke with wife who states she will be at home all day today waiting on [facility] arrival [facility]. [facility] papers placed in chart. RN to call [facility] when ready for DC and notify wife with anticipated time of arrival home. [name],[name] Spouse [phone]       Action/Plan:  Will DC to home via [facility] w home hospice services today.   Expected Discharge Date:  09/29/17               Expected Discharge Plan:  Home w Hospice Care  In-House Referral:  Clinical Social Work  Discharge planning Services  CM Consult  Post Acute Care Choice:  Home Health Choice offered to:  Spouse  DME Arranged:  N/A DME Agency:  NA  HH Arranged:    HH Agency:  [facility]  Status of Service:  Completed, signed off  If discussed at [employer] Meetings, dates discussed:    Additional Comments:  Lawerance SabalDebbie Emile Kyllo, RN 09/29/2017, 11:13 AM

## 2017-09-29 NOTE — Progress Notes (Signed)
Subjective:  Patient seen laying comfortably in air bed this AM. No acute distress. Was able to sit up in bed with multi-person assist, did not appear to be uncomfortable during exam. Patient continues to wear offloading boots on lower extremities, which also appear to be comfortably for him. Patient does not express any discomfort or acute concerns.  Objective:  Vital signs in last 24 hours: Vitals:   09/28/17 1005 09/28/17 1850 09/28/17 2120 09/29/17 0350  BP: (!) 85/52 94/63 (!) 92/52 (!) 87/56  Pulse: 85 78 (!) 101 100  Resp: 18 18 12 14   Temp: 97.9 F (36.6 C) 98.3 F (36.8 C) 98 F (36.7 C) 98.4 F (36.9 C)  TempSrc: Axillary Axillary    SpO2: 98% 99% 100% 100%  Weight:   125 lb 10.6 oz (57 kg)   Height:       Physical Exam  Constitutional:  Severely cachetic appearing man laying in bed in no acute distress, a little more interactive with interviewers during exam today and appears more comfortably than during dialysis yesterday.  Cardiovascular: Normal rate and regular rhythm. Exam reveals no friction rub.  No murmur heard. Respiratory:  Poor effort. No crackles, wheezing. Intermittent transmitted upper airway rhonchi, improved from admission.  Musculoskeletal: He exhibits deformity (Patient's bilateral upper extremities contracted at baseline, unchanged from previous exams). He exhibits no edema (of bilateral lower extremities) or tenderness (of bilateral lower extremities).  Severe muscle wasting throughout, unchanged from previous exams.  Neurological:  Awake, alert. Cooperates with examiners and nods appropriately to yes/no questions.  Unable to spontaneously move all 4 extremities during exam, unchanged from previous exams.   Skin:  Patient in offloading boots and air bed, appears comfortable. No signs of skin breakdown on lower extremities.   Assessment/Plan:  Principal Problem:   Aspiration pneumonia (HCC) Active Problems:   DM type 2 causing ESRD (HCC)  ESRD on dialysis (HCC)   Pressure injury of sacral region, stage 2   Dysphagia due to old stroke   Hypokalemia   Severe protein-calorie malnutrition (HCC)   Hypophosphatemia   Oropharyngeal dysphagia   ESRD (end stage renal disease) (HCC)   Goals of care, counseling/discussion   Advance care planning   Palliative care by specialist   Healthcare-associated pneumonia   Palliative care encounter  Genevie CheshireMichael Behrendt 62 yo with PMH of dysphagia and recurrent aspiration 2/2 multiple prior CVAs, ESRD on HD T/Th/Sat, who presented with complaint of shortness of breath. Patient recently admitted and discharged from OSH on 09/19/2017 after 7 days IV zosyn for treatment of aspiration pneumonia. He was admitted to internal medicine teaching service for management. The specific problems addressed during admission were as follows:  Aspiration PNA 2/2 Dysphagia from multiple previous CVAs: Patient completed 5 day course of IV zosyn for treatment of possible aspiration pneumonia. Patient remains afebrile and hemodynamically stable.  -IV zosyn, 5 days completed course -WBC 15.9, down from 23 on admission -Dysphagia 1 diet -Blood cultures no growth at 5 days (FINAL)  Severe protein-calorie malnutrition: Per chart review family has dismissed idea of tube feeds in patient. Patient transitioning towards comfort care, appreciate palliative's assistance.  Hypokalemia and Hypophosphatemia: Patient received IV phosphorus, magnesium, and potassium replacement on admission. Last Phos = 3.9, K = 2.9 (yesterday, prior to dialysis) -No repeat labs after K+ replacement during dialysis yesterday 2/2 patient comfort  ESRD on HD T,Th,S: Not candidate for outpatient dialysis. 2 outpatient HD sessions coordinated with nephrology to take place after discharge today and transfer  to hospice care afterwards. -Nephrology consulted and appreciated -Case management consulted and appreciated  Chronic Normocytic Anemia: Patient's Hgb  7.1 yesterday, down from admission but stable from two prior readings. He has been chronically between around 8 +/- 1. No overt signs of bleeding on exam. Patient appears comfortable today, will defer transfusion but can be considered if patient symptomatic at outpatient comfort dialysis sessions.   PAD: Patient found to have arterial thrombus in Right LE during prior hospitalization on 12/12 transitioned to Eliquis after subtherapeutic on warfarin. -Continue home Eliquis 2.5 mg BID  Stage II Sacral Pressure Ulcers: Present on admission. No signs or symptoms of infection.  -Wound care consulted, recommendations carried out by nursing staff who are doing an excellent job  FEN/GI: -Dysphagia 1 per speech recommendations -No IVF, replacing electrolytes as needed (see above)  VTE Prophylaxis: Eliquis 2.5 mg daily Code Status: DNR  Dispo: Anticipated discharge today with home hospice.   Rozann Lesches, MD 09/29/2017, 8:08 AM Pager: [phone]

## 2017-09-29 NOTE — Progress Notes (Signed)
Kenneth Mckenzie to be D/C'd Home per MD order.  Discussed prescriptions Kenneth Edelsonand follow up appointments with the patient. Prescriptions given to patient, medication list explained in detail. Pt verbalized understanding.  Allergies as of 09/29/2017      Reactions   Zofran Frazier Richards[ondansetron Hcl] Nausea And Vomiting      Medication List    TAKE these medications   ELIQUIS 2.5 MG Tabs tablet Generic drug:  apixaban Take 2.5 mg by mouth 2 (two) times daily.   lisinopril 40 MG tablet Commonly known as:  PRINIVIL,ZESTRIL Take 40 mg by mouth daily.   metoprolol tartrate 25 MG tablet Commonly known as:  LOPRESSOR Take 12.5 mg by mouth 2 (two) times daily.   nystatin 100000 UNIT/ML suspension Commonly known as:  MYCOSTATIN Take 5 mLs (500,000 Units total) by mouth 4 (four) times daily.   timolol 0.5 % ophthalmic solution Commonly known as:  BETIMOL Place 1 drop into both eyes 2 (two) times daily.       Vitals:   09/29/17 0350 09/29/17 0849  BP: (!) 87/56 110/68  Pulse: 100 (!) 108  Resp: 14 15  Temp: 98.4 F (36.9 C) 98.4 F (36.9 C)  SpO2: 100% 100%    Skin clean, dry and intact without evidence of skin break down, no evidence of skin tears noted. IV catheter discontinued intact. Site without signs and symptoms of complications. Dressing and pressure applied. Pt denies pain at this time. No complaints noted.  An After Visit Summary was printed and given to the patient. Patient escorted via stretcher, and D/C home via [facility].  Kenneth CobbsMolly Weismiller RN [facility] Phone [phone]

## 2017-09-29 NOTE — Progress Notes (Addendum)
Subjective:  Nods head to questioning . Noted for dc today   Objective Vital signs in last 24 hours: Vitals:   09/28/17 1850 09/28/17 2120 09/29/17 0350 09/29/17 0849  BP: 94/63 (!) 92/52 (!) 87/56 110/68  Pulse: 78 (!) 101 100 (!) 108  Resp: 18 12 14 15   Temp: 98.3 F (36.8 C) 98 F (36.7 C) 98.4 F (36.9 C) 98.4 F (36.9 C)  TempSrc: Axillary   Oral  SpO2: 99% 100% 100% 100%  Weight:  57 kg (125 lb 10.6 oz)    Height:       Weight change: -0.067 kg (-2.4 oz)  Physical Exam General:Frail , cachetic AAM, NAD nonverbal , only nods . On nasal oxygen. Contractures in UE. Heart:RRR; no murmur Lungs:CTA anteriorly Extremities:No LE edema Dialysis Access:TDC in L chest; no erythema  Dialysis Orders: TTS @ [facility] 4hr, TDC, EDW 60.5kg (but left at 58.6kg last HD),3K/2.25, heparin 4000 bolus -Hectorol 1mcg IVq HD -Mircera 50 q2weeks (last given 09/21/17)  Problem/Plan: 1. Dyspnea/Aspiration PNA- On Zosynper primary. [facility].  2. HxDysphasia, dysarthriawith previous CVAs 3. ESRD -TTS schedule / Hospice now for dc today then For 2 attempted op txs as op but  noted  4. Anemia - Hgb trending down 7.1 hgb yest , ESA recently dosed 1/3. 5. 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  6. HTN/volume - BP stable. No volume overload on exam. 4kg below EDW by weights here [employer]/keep even as op if goes  7. Severe PCM -Nutritional supplements per RD. 8. Sacral pressure ulcers - WOC following  Lenny Pastelavid Zeyfang, PA-C [facility] [phone] 09/29/2017,9:49 AM  LOS: 7 days   Pt seen, examined and agree w A/P as above.  Vinson Moselleob Pilar Corrales MD [facility] pager [phone]   09/29/2017, 1:36 PM    Labs: Basic Metabolic Panel: Recent Labs  Lab 09/26/17 0524 09/27/17 0613 09/28/17 0657  NA 142 137 138  K 3.0* 3.4* 2.9*  CL 102 100* 99*  CO2 22 22 25   GLUCOSE  116* 144* 137*  BUN 18 9 14   CREATININE 4.34* 2.61* 3.73*  CALCIUM 8.0* 7.9* 7.7*  PHOS 3.4 2.2* 3.9   Liver Function Tests: Recent Labs  Lab 09/23/17 0440 09/23/17 0654  ALBUMIN 1.5* 1.5*   No results for input(s): LIPASE, AMYLASE in the last 168 hours. No results for input(s): AMMONIA in the last 168 hours. CBC: Recent Labs  Lab 09/23/17 0440 09/23/17 0654 09/25/17 0647 09/27/17 0613 09/28/17 0657  WBC 18.3* 19.4* 14.8* 16.4* 15.9*  NEUTROABS  --   --  12.5*  --   --   HGB 8.5* 7.9* 8.0* 7.3* 7.1*  HCT 28.3* 26.0* 26.2* 24.6* 23.5*  MCV 89.6 90.0 89.7 90.8 90.7  PLT 334 328 324 334 335   Cardiac Enzymes: No results for input(s): CKTOTAL, CKMB, CKMBINDEX, TROPONINI in the last 168 hours. CBG: No results for input(s): GLUCAP in the last 168 hours.  Studies/Results: No results found. Medications: . sodium chloride     . apixaban  2.5 mg Oral BID  . doxercalciferol  1 mcg Intravenous Q T,Th,Sa-HD  . mouth rinse  15 mL Mouth Rinse BID  . nystatin  5 mL Oral QID  . sodium chloride flush  3 mL Intravenous Q12H  . sodium chloride flush  3 mL Intravenous Q12H  . timolol  1 drop Both Eyes BID

## 2017-09-29 NOTE — Progress Notes (Signed)
Speech Language Pathology Discharge Patient Details Name: Marlis EdelsonMichael A Cangelosi MRN: [id number] DOB: [birth year] Today's Date: 09/29/2017     Pt is for D/C home today with home hospice.  Our services will respectfully sign off.  Grover Woodfield L. Samson Fredericouture, [place] CCC/SLP Pager [phone]

## 2017-10-20 DEATH — deceased

## 2019-07-27 IMAGING — DX DG CHEST 1V PORT
1 series · 1 of 1 positions shown · non-contrast
Comparison: Prior radiograph from 09/12/2017.

CLINICAL DATA: Initial evaluation for acute shortness of breath,
recent pneumonia.

EXAM:
PORTABLE CHEST 1 VIEW

[chest ap]
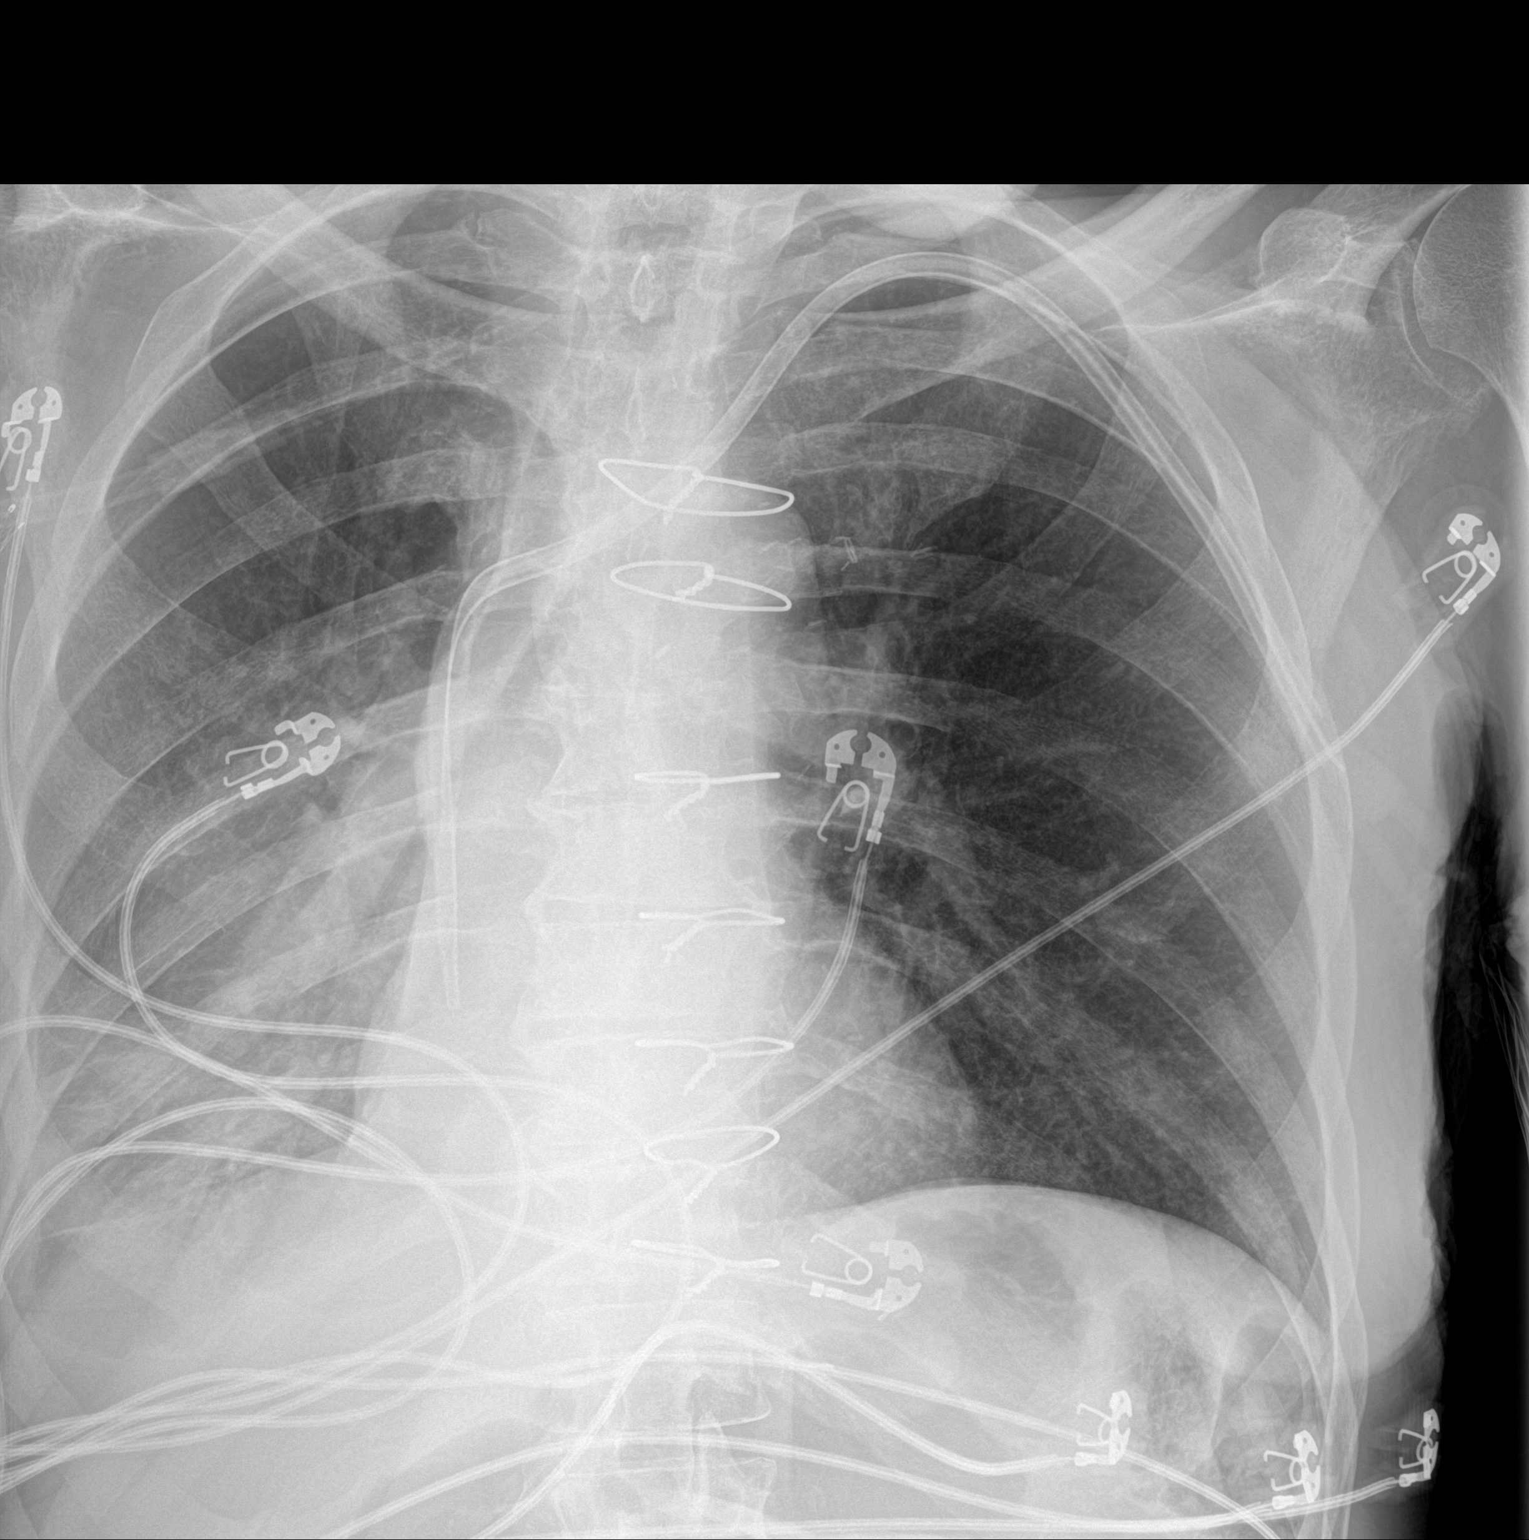

[1 of 1 positions shown; findings below may reference images not displayed]

FINDINGS: Left-sided central venous catheter in place, stable. Median
sternotomy wires noted. Cardiac and mediastinal silhouettes are
stable from previous, and remain within normal limits.

Lungs mildly hypoinflated. Interval improvement in previously seen
left basilar airspace disease. Right basilar infiltrates persist,
but are also slightly improved. There is a persistent right pleural
effusion. No pulmonary edema. No pneumothorax.

No acute osseous abnormality.
IMPRESSION: 1. Persistent but improved bibasilar infiltrates as compared to
previous, most prominent on the right. Persistent small right
pleural effusion.
2. No other new active cardiopulmonary disease.
